# Patient Record
Sex: Male | Born: 1950
Health system: Southern US, Community
[De-identification: ages and names within clinical notes are randomized; demographics above are authoritative.]

## PROBLEM LIST (undated history)

## (undated) DIAGNOSIS — C61 Malignant neoplasm of prostate: Secondary | ICD-10-CM

## (undated) DIAGNOSIS — I10 Essential (primary) hypertension: Secondary | ICD-10-CM

## (undated) DIAGNOSIS — M1990 Unspecified osteoarthritis, unspecified site: Secondary | ICD-10-CM

## (undated) DIAGNOSIS — K649 Unspecified hemorrhoids: Secondary | ICD-10-CM

## (undated) HISTORY — PX: PROSTATECTOMY: SHX69

## (undated) HISTORY — DX: Essential (primary) hypertension: I10

## (undated) HISTORY — DX: Unspecified hemorrhoids: K64.9

## (undated) HISTORY — DX: Malignant neoplasm of prostate: C61

## (undated) HISTORY — PX: KNEE SURGERY: SHX244

---

## 2009-02-16 ENCOUNTER — Encounter: Admission: RE | Admit: 2009-02-16 | Discharge: 2009-02-16 | Payer: Self-pay | Admitting: Cardiology

## 2009-05-22 ENCOUNTER — Ambulatory Visit: Admission: RE | Admit: 2009-05-22 | Discharge: 2009-05-31 | Payer: Self-pay | Admitting: Radiation Oncology

## 2009-06-18 ENCOUNTER — Inpatient Hospital Stay (HOSPITAL_COMMUNITY): Admission: RE | Admit: 2009-06-18 | Discharge: 2009-06-19 | Payer: Self-pay | Admitting: Urology

## 2009-06-18 ENCOUNTER — Encounter (INDEPENDENT_AMBULATORY_CARE_PROVIDER_SITE_OTHER): Payer: Self-pay | Admitting: Urology

## 2010-05-28 ENCOUNTER — Ambulatory Visit: Payer: Self-pay | Admitting: Cardiology

## 2010-07-25 LAB — BASIC METABOLIC PANEL
Chloride: 107 mEq/L (ref 96–112)
GFR calc Af Amer: >60 mL/min (ref >60)
GFR calc non Af Amer: >60 mL/min (ref >60)

## 2010-07-25 LAB — HEMOGLOBIN AND HEMATOCRIT, BLOOD
HCT: 38.8 % — ABNORMAL LOW (ref 39.0–52.0)
HCT: 39.3 % (ref 39.0–52.0)
Hemoglobin: 13 g/dL (ref 13.0–17.0)

## 2010-07-25 LAB — CBC
HCT: 41.4 % (ref 39.0–52.0)
Hemoglobin: 13.8 g/dL (ref 13.0–17.0)
MCHC: 33.4 g/dL (ref 30.0–36.0)
RDW: 14.2 % (ref 11.5–15.5)

## 2010-10-07 IMAGING — CR DG CHEST 2V
2 series · 2 of 2 positions shown · non-contrast
Comparison: None

CLINICAL DATA: For physical exam

CHEST - 2 VIEW

[view not recorded (1 of 2)]
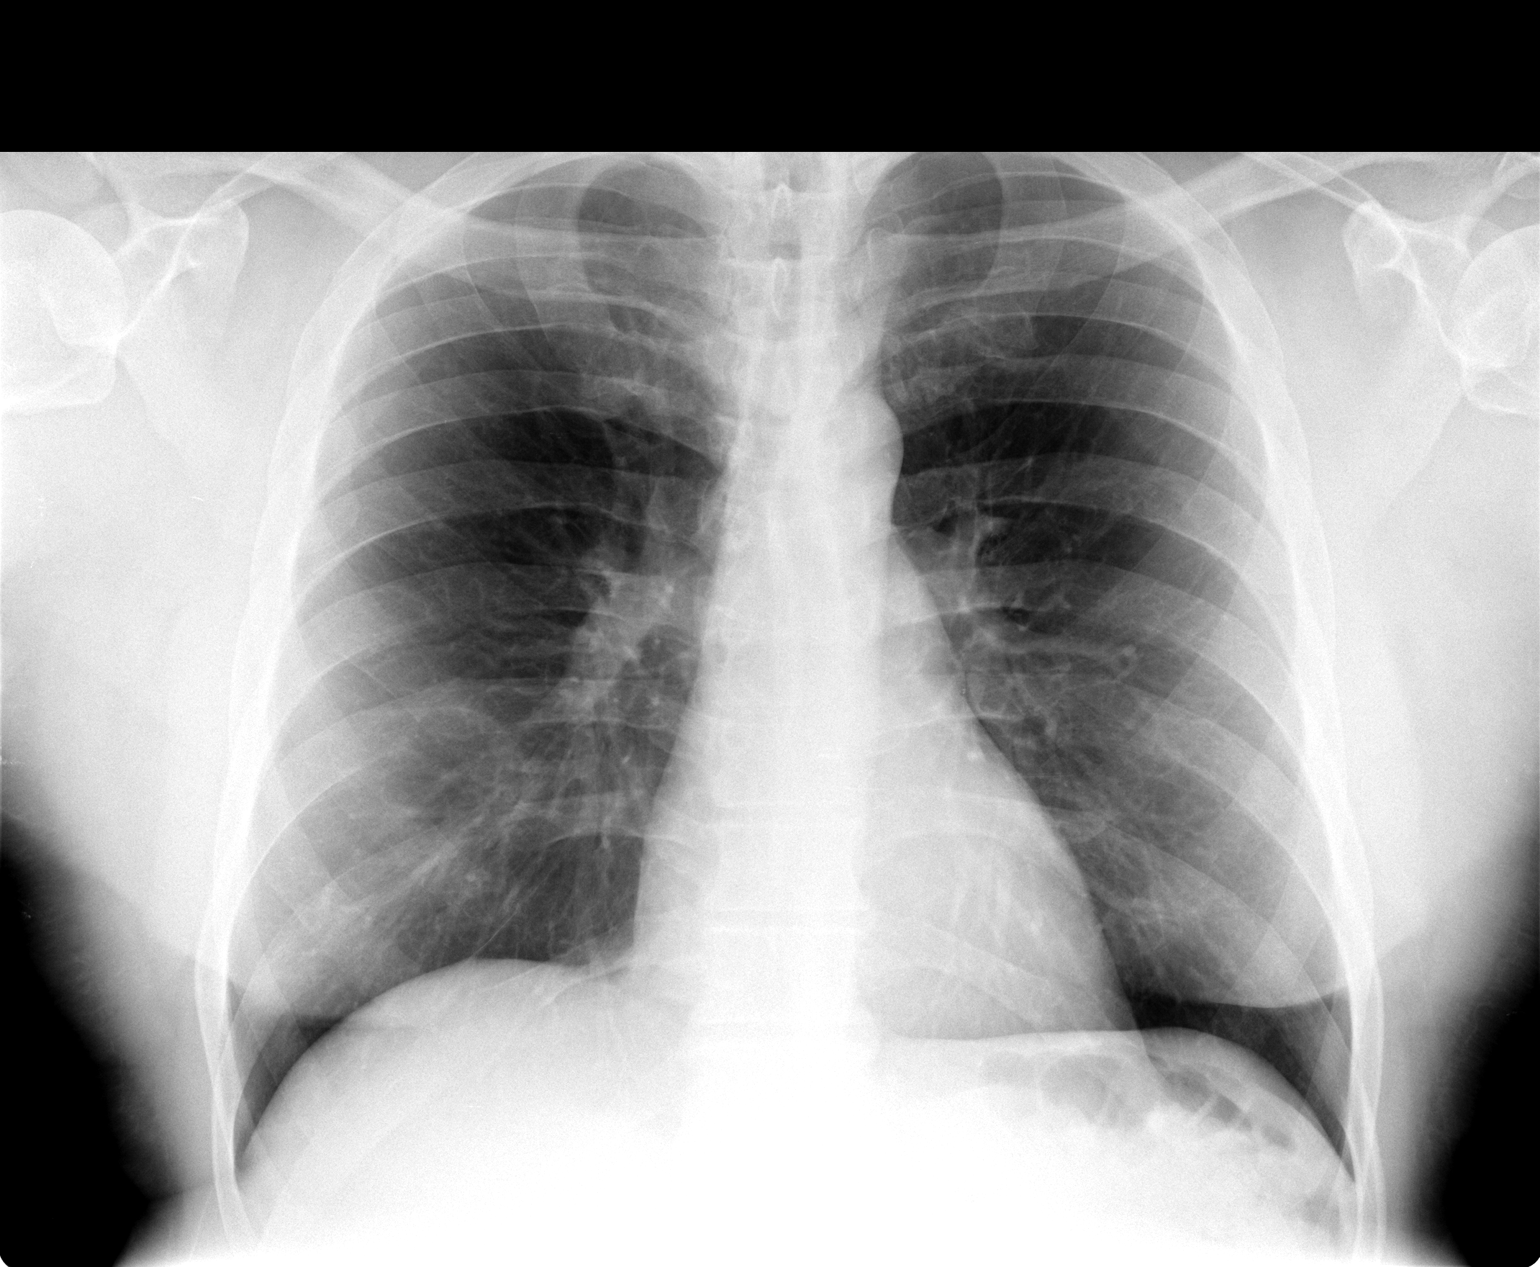

[view not recorded (2 of 2)]
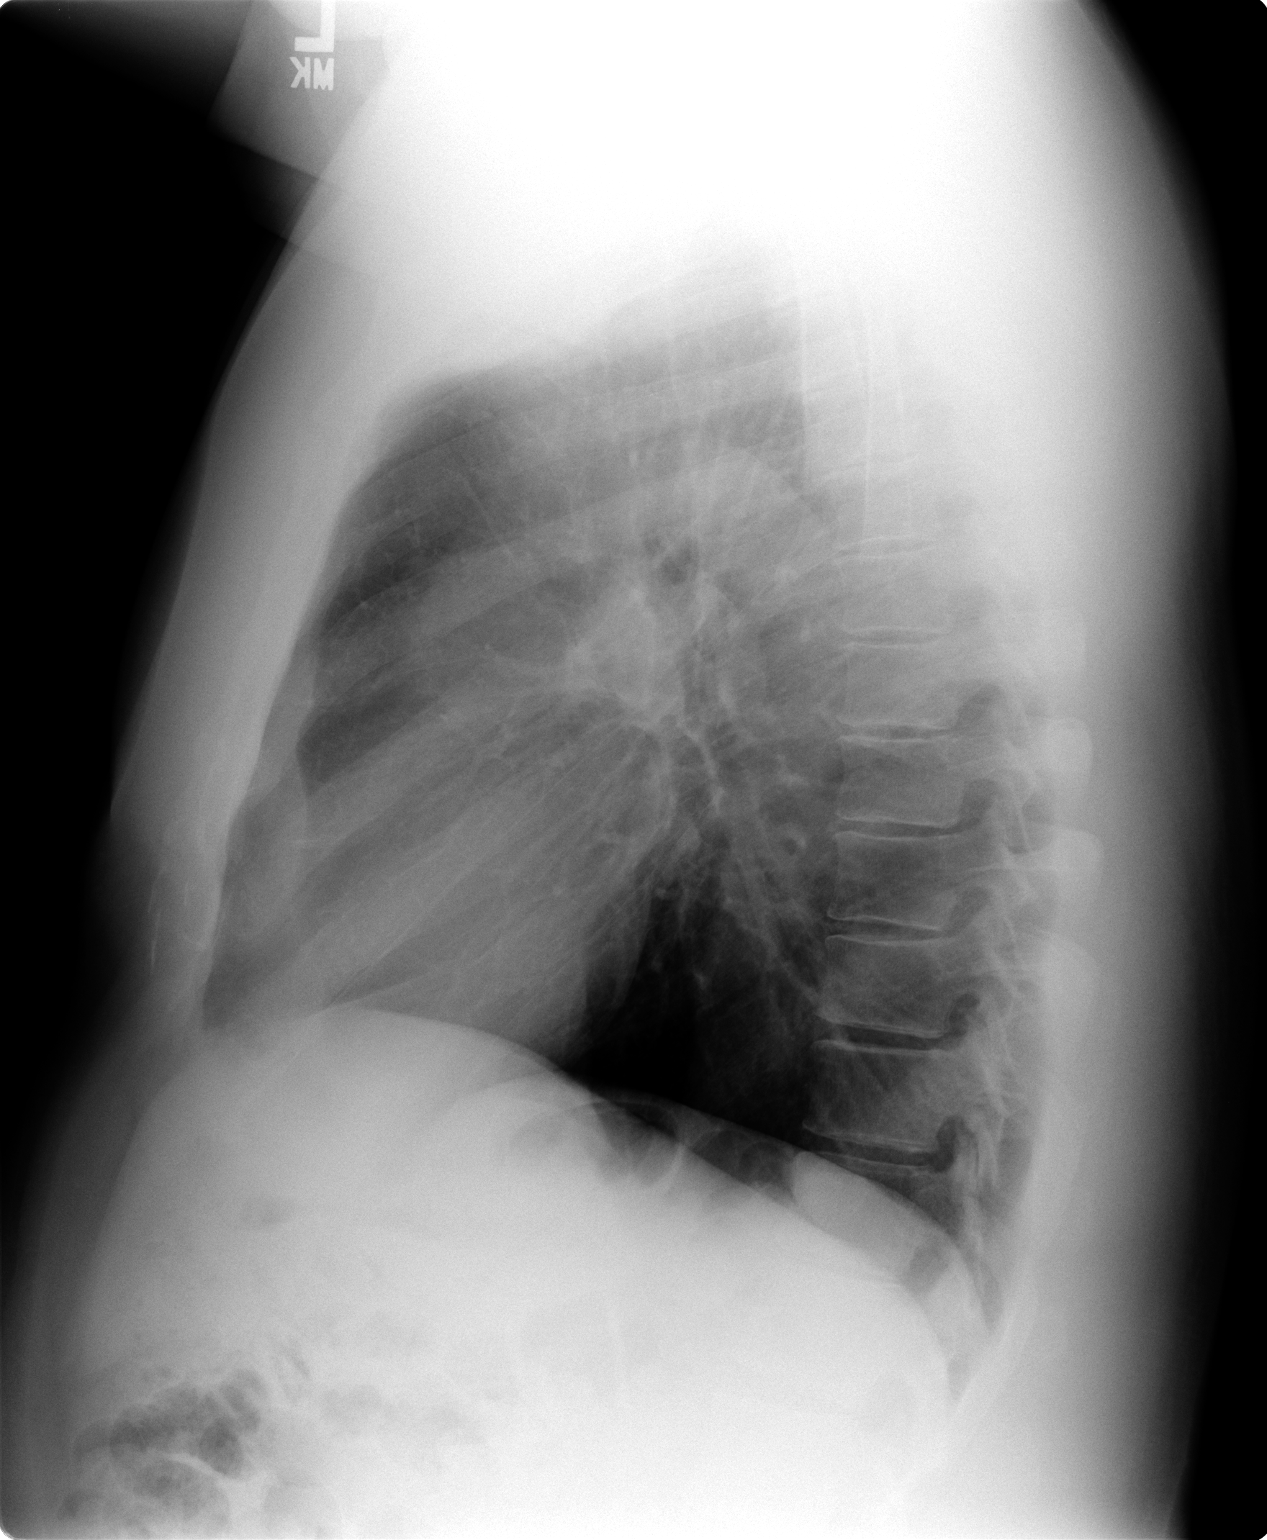

[2 of 2 positions shown; findings below may reference images not displayed]

FINDINGS: The lungs are clear.  No adenopathy is seen.  The heart
is within normal limits in size.  No bony abnormality is seen.
IMPRESSION: No active lung disease.

## 2010-12-10 ENCOUNTER — Encounter: Payer: Self-pay | Admitting: Cardiology

## 2010-12-16 ENCOUNTER — Encounter: Payer: Self-pay | Admitting: Cardiology

## 2010-12-18 ENCOUNTER — Encounter: Payer: Self-pay | Admitting: Cardiology

## 2010-12-18 ENCOUNTER — Ambulatory Visit (INDEPENDENT_AMBULATORY_CARE_PROVIDER_SITE_OTHER): Payer: BC Managed Care – PPO | Admitting: Cardiology

## 2010-12-18 VITALS — BP 120/80 | HR 72 | Wt 197.0 lb

## 2010-12-18 DIAGNOSIS — E7800 Pure hypercholesterolemia, unspecified: Secondary | ICD-10-CM

## 2010-12-18 DIAGNOSIS — C61 Malignant neoplasm of prostate: Secondary | ICD-10-CM

## 2010-12-18 DIAGNOSIS — I119 Hypertensive heart disease without heart failure: Secondary | ICD-10-CM

## 2010-12-18 NOTE — Assessment & Plan Note (Signed)
The patient has a past history of hypercholesterolemia.  His LDL cholesterol on February 16, 2009 was 140.  His LDL cholesterol on 05/24/10 was 141.  The patient was given Lipitor 20 mg daily at that time but took only one month and then stopped it and decided to try to do exercise instead.  He will return tomorrow for a fasting lipid panel to see where we are at the present time.

## 2010-12-18 NOTE — Progress Notes (Signed)
David Mcintyre Date of Birth:  12/09/1950 [facility] [address]   [address] [place], [place]  [address] [phone]           Fax   [phone]  HPI: This very pleasant 60 year old gentleman is seen for a six-month followup office visit.  He has a past history of mild diastolic hypertension and a past history of hypercholesterolemia.  He is not presently on any cardiovascular medication.  His only medicine is glucosamine for arthritis.  He has a past history of prostate cancer and Dr. Laverle Patter did a radical prostatectomy on 06/18/09.  The patient has gotten along well from his surgery.  Since we last saw him he has made a concentrated effort to exercise and to watch his diet and as a result he has been able to lose 21 pounds.  He has been walking and playing golf for exercise.  He is a widower, his wife having died of breast cancer just over a year ago.  Current Outpatient Prescriptions  Medication Sig Dispense Refill  . GLUCOSAMINE HCL PO Take by mouth daily.          No Known Allergies  There is no problem list on file for this patient.   History  Smoking status  . Never Smoker   Smokeless tobacco  . Not on file    History  Alcohol Use No    Family History  Problem Relation Age of Onset  . Cancer Mother     Review of Systems: The patient denies any heat or cold intolerance.  No weight gain or weight loss.  The patient denies headaches or blurry vision.  There is no cough or sputum production.  The patient denies dizziness.  There is no hematuria or hematochezia.  The patient denies any muscle aches or arthritis.  The patient denies any rash.  The patient denies frequent falling or instability.  There is no history of depression or anxiety.  All other systems were reviewed and are negative.   Physical Exam: Filed Vitals:   12/18/10 1138  BP: 120/80  Pulse: 72  The general appearance feels a well-developed well-nourished gentleman in no  distress.The head and neck exam reveals pupils equal and reactive.  Extraocular movements are full.  There is no scleral icterus.  The mouth and pharynx are normal.  The neck is supple.  The carotids reveal no bruits.  The jugular venous pressure is normal.  The  thyroid is not enlarged.  There is no lymphadenopathy.  The chest is clear to percussion and auscultation.  There are no rales or rhonchi.  Expansion of the chest is symmetrical.  The precordium is quiet.  The first heart sound is normal.  The second heart sound is physiologically split.  There is no murmur gallop rub or click.  There is no abnormal lift or heave.  The abdomen is soft and nontender.  The bowel sounds are normal.  The liver and spleen are not enlarged.  There are no abdominal masses.  There are no abdominal bruits.  Extremities reveal good pedal pulses.  There is no phlebitis or edema.  There is no cyanosis or clubbing.  Strength is normal and symmetrical in all extremities.  There is no lateralizing weakness.  There are no sensory deficits.  The skin is warm and dry.  There is no rash.     Assessment / Plan: Continue prudent diet and exercise program.  He will return tomorrow for fasting  lab work.  Recheck in 6 months for followup office visit and fasting lab work.

## 2010-12-18 NOTE — Assessment & Plan Note (Signed)
The patient has a past history of essential hypertension.  Today's blood pressure is normal on careful diet and weight loss regimen

## 2010-12-18 NOTE — Assessment & Plan Note (Signed)
The patient has a past history of prostate cancer.  He underwent radical prostatectomy on 06/18/09 by Dr. Laverle Patter.  Clinically he has been doing well

## 2010-12-19 ENCOUNTER — Other Ambulatory Visit: Payer: Self-pay | Admitting: Cardiology

## 2010-12-19 ENCOUNTER — Other Ambulatory Visit: Payer: BC Managed Care – PPO | Admitting: *Deleted

## 2010-12-19 ENCOUNTER — Other Ambulatory Visit (INDEPENDENT_AMBULATORY_CARE_PROVIDER_SITE_OTHER): Payer: BC Managed Care – PPO | Admitting: *Deleted

## 2010-12-19 DIAGNOSIS — I119 Hypertensive heart disease without heart failure: Secondary | ICD-10-CM

## 2010-12-20 LAB — LIPID PANEL
Cholesterol: 204 mg/dL — ABNORMAL HIGH (ref 0–200)
HDL: 70 mg/dL (ref >39.00)
Total CHOL/HDL Ratio: 3
Triglycerides: 43 mg/dL (ref 0.0–149.0)
VLDL: 8.6 mg/dL (ref 0.0–40.0)

## 2010-12-20 LAB — BASIC METABOLIC PANEL
Chloride: 109 mEq/L (ref 96–112)
Creatinine, Ser: 1.1 mg/dL (ref 0.4–1.5)
GFR: 90.66 mL/min (ref >60.00)
Potassium: 4.3 mEq/L (ref 3.5–5.1)
Sodium: 140 mEq/L (ref 135–145)

## 2010-12-20 LAB — HEPATIC FUNCTION PANEL
ALT: 25 U/L (ref 0–53)
AST: 32 U/L (ref 0–37)
Albumin: 4.4 g/dL (ref 3.5–5.2)
Total Bilirubin: 0.9 mg/dL (ref 0.3–1.2)
Total Protein: 7.4 g/dL (ref 6.0–8.3)

## 2010-12-20 LAB — LDL CHOLESTEROL, DIRECT: Direct LDL: 123.4 mg/dL

## 2010-12-23 ENCOUNTER — Telehealth: Payer: Self-pay | Admitting: *Deleted

## 2010-12-23 NOTE — Telephone Encounter (Signed)
Message copied by Eugenia Pancoast on Mon Dec 23, 2010 10:14 AM ------      Message from: Cassell Clement      Created: Sat Dec 21, 2010  8:08 PM       LDL improving so stay on careful diet and we do not need to restart the statin yet.

## 2010-12-23 NOTE — Telephone Encounter (Signed)
Advised of labs 

## 2013-06-08 ENCOUNTER — Encounter: Payer: Self-pay | Admitting: Gastroenterology

## 2013-06-14 ENCOUNTER — Ambulatory Visit (INDEPENDENT_AMBULATORY_CARE_PROVIDER_SITE_OTHER): Payer: BC Managed Care – PPO | Admitting: Gastroenterology

## 2013-06-14 ENCOUNTER — Encounter: Payer: Self-pay | Admitting: Gastroenterology

## 2013-06-14 VITALS — BP 122/78 | HR 76 | Ht 68.0 in | Wt 221.8 lb

## 2013-06-14 DIAGNOSIS — K625 Hemorrhage of anus and rectum: Secondary | ICD-10-CM

## 2013-06-14 MED ORDER — HYDROCORTISONE ACETATE 25 MG RE SUPP: 25.0000 mg | Freq: Two times a day (BID) | RECTAL | Status: DC

## 2013-06-14 NOTE — Progress Notes (Signed)
    _                                                                                                                History of Present Illness:  63 year old Afro-American male with history of prostate CA, status post prostatectomy, referred for evaluation of rectal bleeding.  On one occasion he saw blood mixed with stools.  This was when he had a hard bowel movement.  He denies change of bowel habits or abdominal pain.  He does have rectal discomfort and pruritus.    Past Medical History  Diagnosis Date  . Prostate cancer   . Hypertension     MILD DIASTOLIC HYPERTENSION   Past Surgical History  Procedure Laterality Date  . Prostatectomy      RADICAL PROSTATECTOMY  . Knee surgery     family history includes Cancer in his mother. Current Outpatient Prescriptions  Medication Sig Dispense Refill  . vitamin B-12 (CYANOCOBALAMIN) 1000 MCG tablet Take 1,000 mcg by mouth daily.      Marland Kitchen GLUCOSAMINE HCL PO Take by mouth daily.         No current facility-administered medications for this visit.   Allergies as of 06/14/2013  . (No Known Allergies)    reports that he has never smoked. He has never used smokeless tobacco. He reports that he does not drink alcohol or use illicit drugs.     Review of Systems: Pertinent positive and negative review of systems were noted in the above HPI section. All other review of systems were otherwise negative.  Vital signs were reviewed in today's medical record Physical Exam: General: Well developed , well nourished, no acute distress Skin: anicteric Head: Normocephalic and atraumatic Eyes:  sclerae anicteric, EOMI Ears: Normal auditory acuity Mouth: No deformity or lesions Neck: Supple, no masses or thyromegaly Lungs: Clear throughout to auscultation Heart: Regular rate and rhythm; no murmurs, rubs or bruits Abdomen: Soft, non tender and non distended. No masses, hepatosplenomegaly or hernias noted. Normal Bowel sounds Rectal:  There are no external lesions Musculoskeletal: Symmetrical with no gross deformities  Skin: No lesions on visible extremities Pulses:  Normal pulses noted Extremities: No clubbing, cyanosis, edema or deformities noted Neurological: Alert oriented x 4, grossly nonfocal Cervical Nodes:  No significant cervical adenopathy Inguinal Nodes: No significant inguinal adenopathy Psychological:  Alert and cooperative. Normal mood and affect  See Assessment and Plan under Problem List

## 2013-06-14 NOTE — Patient Instructions (Signed)
You have been scheduled for a colonoscopy with propofol. Please follow written instructions given to you at your visit today.  Please pick up your prep kit at the pharmacy within the next 1-3 days. If you use inhalers (even only as needed), please bring them with you on the day of your procedure. Your physician has requested that you go to www.startemmi.com and enter the access code given to you at your visit today. This web site gives a general overview about your procedure. However, you should still follow specific instructions given to you by our office regarding your preparation for the procedure.  We have given you a Moviprep kit today 

## 2013-06-14 NOTE — Assessment & Plan Note (Signed)
Has had one very limited episode of rectal bleeding.  Symptoms could be do to hemorrhoids.  A more proximal colonic bleeding source should be ruled out.  Recommendations #1 colonoscopy #2 Anusol HC suppositories

## 2013-07-06 ENCOUNTER — Ambulatory Visit (AMBULATORY_SURGERY_CENTER): Payer: BC Managed Care – PPO | Admitting: Gastroenterology

## 2013-07-06 ENCOUNTER — Encounter: Payer: Self-pay | Admitting: Gastroenterology

## 2013-07-06 VITALS — BP 122/79 | HR 71 | Temp 98.6°F | Resp 16 | Ht 68.0 in | Wt 221.0 lb

## 2013-07-06 DIAGNOSIS — K648 Other hemorrhoids: Secondary | ICD-10-CM

## 2013-07-06 DIAGNOSIS — K625 Hemorrhage of anus and rectum: Secondary | ICD-10-CM

## 2013-07-06 MED ORDER — SODIUM CHLORIDE 0.9 % IV SOLN: 500.0000 mL | INTRAVENOUS | Status: DC

## 2013-07-06 MED ORDER — HYDROCORTISONE ACETATE 25 MG RE SUPP: 25.0000 mg | Freq: Two times a day (BID) | RECTAL | Status: DC

## 2013-07-06 NOTE — Progress Notes (Signed)
Lidocaine-40mg IV prior to Propofol InductionPropofol given over incremental dosages 

## 2013-07-06 NOTE — Patient Instructions (Signed)
YOU HAD AN ENDOSCOPIC PROCEDURE TODAY AT [facility]: Refer to the procedure report that was given to you for any specific questions about what was found during the examination.  If the procedure report does not answer your questions, please call your gastroenterologist to clarify.  If you requested that your care partner not be given the details of your procedure findings, then the procedure report has been included in a sealed envelope for you to review at your convenience later.  YOU SHOULD EXPECT: Some feelings of bloating in the abdomen. Passage of more gas than usual.  Walking can help get rid of the air that was put into your GI tract during the procedure and reduce the bloating. If you had a lower endoscopy (such as a colonoscopy or flexible sigmoidoscopy) you may notice spotting of blood in your stool or on the toilet paper. If you underwent a bowel prep for your procedure, then you may not have a normal bowel movement for a few days.  DIET: Your first meal following the procedure should be a light meal and then it is ok to progress to your normal diet.  A half-sandwich or bowl of soup is an example of a good first meal.  Heavy or fried foods are harder to digest and may make you feel nauseous or bloated.  Likewise meals heavy in dairy and vegetables can cause extra gas to form and this can also increase the bloating.  Drink plenty of fluids but you should avoid alcoholic beverages for 24 hours.  ACTIVITY: Your care partner should take you home directly after the procedure.  You should plan to take it easy, moving slowly for the rest of the day.  You can resume normal activity the day after the procedure however you should NOT DRIVE or use heavy machinery for 24 hours (because of the sedation medicines used during the test).    SYMPTOMS TO REPORT IMMEDIATELY: A gastroenterologist can be reached at any hour.  During normal business hours, 8:30 AM to 5:00 PM Monday through Friday,  call [phone].  After hours and on weekends, please call the GI answering service at [phone] who will take a message and have the physician on call contact you.   Following lower endoscopy (colonoscopy or flexible sigmoidoscopy):  Excessive amounts of blood in the stool  Significant tenderness or worsening of abdominal pains  Swelling of the abdomen that is new, acute  Fever of 100F or higher  FOLLOW UP: If any biopsies were taken you will be contacted by phone or by letter within the next 1-3 weeks.  Call your gastroenterologist if you have not heard about the biopsies in 3 weeks.  Our staff will call the home number listed on your records the next business day following your procedure to check on you and address any questions or concerns that you may have at that time regarding the information given to you following your procedure. This is a courtesy call and so if there is no answer at the home number and we have not heard from you through the emergency physician on call, we will assume that you have returned to your regular daily activities without incident.   Hemorrhoids, banding-handouts given  anusol suppositories as needed for rectal bleeding.  Repeat colonoscopy 10 years.   SIGNATURES/CONFIDENTIALITY: You and/or your care partner have signed paperwork which will be entered into your electronic medical record.  These signatures attest to the fact that that the information above  on your After Visit Summary has been reviewed and is understood.  Full responsibility of the confidentiality of this discharge information lies with you and/or your care-partner.

## 2013-07-06 NOTE — Op Note (Signed)
[facility]  [address] [place], [address]   COLONOSCOPY PROCEDURE REPORT  PATIENT: David Mcintyre, David Mcintyre  MR#: [id number] BIRTHDATE: 03-01-1951 , 62  yrs. old GENDER: Male ENDOSCOPIST: Inda Castle, MD REFERRED PX:TGGYIR Mare Ferrari, M.D. PROCEDURE DATE:  07/06/2013 PROCEDURE:   Colonoscopy, diagnostic First Screening Colonoscopy - Avg.  risk and is 50 yrs.  old or older Yes.  Prior Negative Screening - Now for repeat screening. N/A  History of Adenoma - Now for follow-up colonoscopy & has been > or = to 3 yrs.  N/A  Polyps Removed Today? No.  Recommend repeat exam, <10 yrs? No. ASA CLASS:   Class II INDICATIONS:Rectal Bleeding. MEDICATIONS: MAC sedation, administered by CRNA and propofol (Diprivan) 250mg  IV  DESCRIPTION OF PROCEDURE:   After the risks benefits and alternatives of the procedure were thoroughly explained, informed consent was obtained.  A digital rectal exam revealed no abnormalities of the rectum.   The LB [id number]  endoscope was introduced through the anus and advanced to the cecum, which was identified by both the appendix and ileocecal valve. No adverse events experienced.   The quality of the prep was Suprep good  The instrument was then slowly withdrawn as the colon was fully examined.      COLON FINDINGS: Internal hemorrhoids were found.   A normal appearing cecum, ileocecal valve, and appendiceal orifice were identified.  The ascending, hepatic flexure, transverse, splenic flexure, descending, sigmoid colon and rectum appeared unremarkable.  No polyps or cancers were seen.  Retroflexed views revealed no abnormalities. The time to cecum=3 minutes 18 seconds. Withdrawal time=9 minutes 56 seconds.  The scope was withdrawn and the procedure completed. COMPLICATIONS: There were no complications.  ENDOSCOPIC IMPRESSION: 1.   Internal hemorrhoids (source for limited rectal bleeding) 2.   Normal colon  RECOMMENDATIONS: 1.   Anusol suppositories as needed for rectal bleeding 2.  colonoscopy 10 years  eSigned:  Inda Castle, MD 07/06/2013 3:55 PM   cc:   PATIENT NAME:  David Mcintyre, David Mcintyre MR#: [id number]

## 2013-07-07 ENCOUNTER — Telehealth: Payer: Self-pay | Admitting: *Deleted

## 2013-07-07 NOTE — Telephone Encounter (Signed)
  Follow up Call-  Call back number 07/06/2013  Post procedure Call Back phone  # [phone]  Permission to leave phone message Yes    [facility]

## 2016-02-03 ENCOUNTER — Emergency Department (HOSPITAL_COMMUNITY): Payer: Medicare Other

## 2016-02-03 ENCOUNTER — Encounter (HOSPITAL_COMMUNITY): Payer: Self-pay | Admitting: *Deleted

## 2016-02-03 ENCOUNTER — Emergency Department (HOSPITAL_COMMUNITY)
Admission: EM | Admit: 2016-02-03 | Discharge: 2016-02-03 | Disposition: A | Discharge status: Home / Self Care | Payer: Medicare Other | Attending: Emergency Medicine | Admitting: Emergency Medicine

## 2016-02-03 DIAGNOSIS — Y999 Unspecified external cause status: Secondary | ICD-10-CM | POA: Insufficient documentation

## 2016-02-03 DIAGNOSIS — I10 Essential (primary) hypertension: Secondary | ICD-10-CM | POA: Diagnosis not present

## 2016-02-03 DIAGNOSIS — X501XXA Overexertion from prolonged static or awkward postures, initial encounter: Secondary | ICD-10-CM | POA: Insufficient documentation

## 2016-02-03 DIAGNOSIS — Z79899 Other long term (current) drug therapy: Secondary | ICD-10-CM | POA: Insufficient documentation

## 2016-02-03 DIAGNOSIS — Z8546 Personal history of malignant neoplasm of prostate: Secondary | ICD-10-CM | POA: Diagnosis not present

## 2016-02-03 DIAGNOSIS — S86912A Strain of unspecified muscle(s) and tendon(s) at lower leg level, left leg, initial encounter: Secondary | ICD-10-CM | POA: Diagnosis not present

## 2016-02-03 DIAGNOSIS — Y929 Unspecified place or not applicable: Secondary | ICD-10-CM | POA: Diagnosis not present

## 2016-02-03 DIAGNOSIS — Y9389 Activity, other specified: Secondary | ICD-10-CM | POA: Diagnosis not present

## 2016-02-03 DIAGNOSIS — S8992XA Unspecified injury of left lower leg, initial encounter: Secondary | ICD-10-CM | POA: Diagnosis present

## 2016-02-03 NOTE — Discharge Instructions (Signed)
Your x-ray today showed no acute abnormalities. We gave you a knee sleeve to use for compression and stability. Keep your leg elevated when resting at home and ice on and off for the next 48 hrs. Please follow up with Dr. Ninfa Linden of [facility] for further evaluation. In the meantime you may take over the counter ibuprofen or Tylenol as needed for pain. Return to the ER for new or worsening symptoms.

## 2016-02-03 NOTE — ED Triage Notes (Addendum)
Pt was removing his shoes and felt something pop a little under his left knee. Pain with certain movements. Son is at the bedside. Pt placed on a hospital gown. Very pleasant and cooperative. Pt taken via wheelchair to the x-ray dept. (1:45pm)

## 2016-02-03 NOTE — ED Provider Notes (Signed)
[facility] DEPT Provider Note   CSN: [id number] Arrival date & time: 02/03/16  1314  By signing my name below, I, Soijett Blue, attest that this documentation has been prepared under the direction and in the presence of Alnisa Hasley Y. Lash Matulich, PA-C Electronically Signed: Soijett Blue, ED Scribe. 02/03/16. 1:40 PM.   History   Chief Complaint Chief Complaint  Patient presents with  . Knee Pain    Pt was taking off his dress shoes and felt a pop in his left knee.    HPI David Mcintyre is a 65 y.o. male with a PMHx of HTN, prostate CA, who presents to the Emergency Department complaining of left knee pain onset PTA. Pt notes that he was bent over taking off his dress shoes when he felt a pop to his posterior left knee. Pt states that his posterior left knee is worsened with weight bearing and denies alleviating factors. Pt denies left knee pain at this time. Pt is having associated symptoms of gait problem due to pain. He notes that he has not tried any medications for the relief of his symptoms. He denies color change, wound, rash, swelling, left ankle pain, and any other symptoms. Pt denies seeing an orthopedic doctor at this time.    The history is provided by the patient. No language interpreter was used.    Past Medical History:  Diagnosis Date  . Hypertension    MILD DIASTOLIC HYPERTENSION  . Prostate cancer [facility])     Patient Active Problem List   Diagnosis Date Noted  . Hemorrhage of rectum and anus 06/14/2013  . Hypercholesterolemia 12/18/2010  . Benign hypertensive heart disease without heart failure 12/18/2010  . Prostate cancer ([facility]) 12/18/2010    Past Surgical History:  Procedure Laterality Date  . KNEE SURGERY    . PROSTATECTOMY     RADICAL PROSTATECTOMY       Home Medications    Prior to Admission medications   Medication Sig Start Date End Date Taking? Authorizing Provider  GLUCOSAMINE HCL PO Take by mouth daily.     Yes Historical Provider, MD  vitamin  B-12 (CYANOCOBALAMIN) 1000 MCG tablet Take 1,000 mcg by mouth daily.   Yes Historical Provider, MD  hydrocortisone (ANUSOL-HC) 25 MG suppository Place 1 suppository (25 mg total) rectally every 12 (twelve) hours. 07/06/13   Inda Castle, MD    Family History Family History  Problem Relation Age of Onset  . Cancer Mother     Social History Social History  Substance Use Topics  . Smoking status: Never Smoker  . Smokeless tobacco: Never Used  . Alcohol use No     Allergies   Review of patient's allergies indicates no known allergies.   Review of Systems Review of Systems  Musculoskeletal: Positive for arthralgias (posterior left knee) and gait problem (due to pain). Negative for joint swelling.  Skin: Negative for color change, rash and wound.  All other systems reviewed and are negative.    Physical Exam Updated Vital Signs BP 114/84 (BP Location: Right Arm)   Pulse 80   Temp 98.2 F (36.8 C) (Oral)   Resp 20   Ht 5\' 8"  (1.727 m)   Wt 190 lb (86.2 kg)   SpO2 99%   BMI 28.89 kg/m   Physical Exam  Constitutional: He is oriented to person, place, and time. He appears well-developed and well-nourished. No distress.  Well appearing NAD.   HENT:  Head: Normocephalic and atraumatic.  Eyes: EOM are normal.  Neck: Neck supple.  Cardiovascular: Normal rate.   2+ posterior tibialis and dorsalis pedis pulses.   Pulmonary/Chest: Effort normal. No respiratory distress.  Abdominal: He exhibits no distension.  Musculoskeletal: Normal range of motion.       Left knee: He exhibits normal range of motion, no swelling, no LCL laxity and no MCL laxity. No tenderness found.  No tenderness to knee or posterior popliteal region. No edema. Full ROM with no laxity or crepitus, but pt reports pain with knee flexion against resistance.   Neurological: He is alert and oriented to person, place, and time.  5/5 strength throughout  Skin: Skin is warm and dry.  Psychiatric: He has a  normal mood and affect. His behavior is normal.  Nursing note and vitals reviewed.   ED Treatments / Results  DIAGNOSTIC STUDIES: Oxygen Saturation is 99% on RA, nl by my interpretation.    COORDINATION OF CARE: 1:37 PM Discussed treatment plan with pt at bedside which includes left knee xray, knee brace, use motrin PRN, and referral and follow up with orthopedist, and pt agreed to plan.   Radiology Dg Knee Complete 4 Views Left  Result Date: 02/03/2016 CLINICAL DATA:  Pt was removing his shoes and felt something "pop" behind the left knee. Pt complains of pain and limited movement. EXAM: LEFT KNEE - COMPLETE 4+ VIEW COMPARISON:  None. FINDINGS: No fracture of the proximal tibia or distal femur. Patella is normal. No joint effusion. IMPRESSION: No acute osseous abnormality. Electronically Signed   By: Suzy Bouchard M.D.   On: 02/03/2016 14:03    Procedures Procedures (including critical care time)  Medications Ordered in ED Medications - No data to display   Initial Impression / Assessment and Plan / ED Course  I have reviewed the triage vital signs and the nursing notes.  Pertinent imaging results that were available during my care of the patient were reviewed by me and considered in my medical decision making (see chart for details).  Clinical Course    Patient X-Ray negative for obvious fracture or dislocation.  Pt advised to follow up with orthopedics. Patient given knee sleeve while in ED, conservative therapy recommended and discussed. Patient will be discharged home & is agreeable with above plan. Returns precautions discussed. Pt appears safe for discharge.  Final Clinical Impressions(s) / ED Diagnoses   Final diagnoses:  Strain of left knee, initial encounter    New Prescriptions New Prescriptions   No medications on file    I personally performed the services described in this documentation, which was scribed in my presence. The recorded information has been  reviewed and is accurate.    Anne Ng, PA-C 02/03/16 [place], MD 02/03/16 [phone]

## 2016-02-12 ENCOUNTER — Ambulatory Visit (INDEPENDENT_AMBULATORY_CARE_PROVIDER_SITE_OTHER): Payer: Self-pay | Admitting: Orthopaedic Surgery

## 2016-02-13 ENCOUNTER — Ambulatory Visit (INDEPENDENT_AMBULATORY_CARE_PROVIDER_SITE_OTHER): Payer: Medicare Other | Admitting: Orthopaedic Surgery

## 2016-02-13 DIAGNOSIS — S76312A Strain of muscle, fascia and tendon of the posterior muscle group at thigh level, left thigh, initial encounter: Secondary | ICD-10-CM | POA: Diagnosis not present

## 2016-02-14 ENCOUNTER — Ambulatory Visit (INDEPENDENT_AMBULATORY_CARE_PROVIDER_SITE_OTHER): Payer: Self-pay | Admitting: Orthopaedic Surgery

## 2016-02-19 ENCOUNTER — Ambulatory Visit (INDEPENDENT_AMBULATORY_CARE_PROVIDER_SITE_OTHER): Payer: Self-pay | Admitting: Orthopaedic Surgery

## 2016-02-20 ENCOUNTER — Ambulatory Visit (INDEPENDENT_AMBULATORY_CARE_PROVIDER_SITE_OTHER): Payer: Self-pay | Admitting: Orthopaedic Surgery

## 2018-04-22 ENCOUNTER — Ambulatory Visit: Payer: Medicare Other | Admitting: Gastroenterology

## 2018-04-22 ENCOUNTER — Encounter: Payer: Self-pay | Admitting: Gastroenterology

## 2018-04-22 VITALS — BP 140/84 | HR 84 | Ht 68.0 in | Wt 224.0 lb

## 2018-04-22 DIAGNOSIS — K6289 Other specified diseases of anus and rectum: Secondary | ICD-10-CM

## 2018-04-22 DIAGNOSIS — K644 Residual hemorrhoidal skin tags: Secondary | ICD-10-CM

## 2018-04-22 NOTE — Progress Notes (Signed)
[facility] Consult Note:  History: David Mcintyre 04/22/2018  Referring physician: Darlin Coco, MD  Reason for consult/chief complaint: Hemorrhoids (pt has had some rectal discomfort and when he wipes after a bm he can sometimes feel something protruding; denies seeing any blood)   Subjective  HPI:  This is a very pleasant 67 year old man referred to see [place] for rectal discomfort.  He saw Dr. Deatra Ina in 2015 for rectal pain and bleeding, and a colonoscopy at that time with a good preparation was normal except for reported internal hemorrhoids.  For the several months David Mcintyre has been bothered by a feeling of rectal pressure that is worse when he sits down.  It does not bother him standing or walking.  There is no pain with bowel movements or rectal bleeding.  He feels as if there is a palpable abnormality in the anal canal. He denies chronic abdominal pain, constipation, diarrhea, dysphagia, odynophagia, vomiting or weight loss.  ROS:  Review of Systems He denies chest pain dyspnea or dysuria  Past Medical History: Past Medical History:  Diagnosis Date  . Hemorrhoids   . Hypertension    MILD DIASTOLIC HYPERTENSION  . Prostate cancer [facility])      Past Surgical History: Past Surgical History:  Procedure Laterality Date  . KNEE SURGERY    . PROSTATECTOMY     RADICAL PROSTATECTOMY     Family History: Family History  Problem Relation Age of Onset  . Cancer Mother     Social History: Social History   Socioeconomic History  . Marital status: Married    Spouse name: Not on file  . Number of children: 2  . Years of education: Not on file  . Highest education level: Not on file  Occupational History  . Occupation: Retired    Fish farm manager: RETIRED  Social Needs  . Financial resource strain: Not on file  . Food insecurity:    Worry: Not on file    Inability: Not on file  . Transportation needs:    Medical: Not on file    Non-medical:  Not on file  Tobacco Use  . Smoking status: Never Smoker  . Smokeless tobacco: Never Used  Substance and Sexual Activity  . Alcohol use: No  . Drug use: No  . Sexual activity: Not on file  Lifestyle  . Physical activity:    Days per week: Not on file    Minutes per session: Not on file  . Stress: Not on file  Relationships  . Social connections:    Talks on phone: Not on file    Gets together: Not on file    Attends religious service: Not on file    Active member of club or organization: Not on file    Attends meetings of clubs or organizations: Not on file    Relationship status: Not on file  Other Topics Concern  . Not on file  Social History Narrative  . Not on file    Allergies: No Known Allergies  Outpatient Meds: Current Outpatient Medications  Medication Sig Dispense Refill  . GLUCOSAMINE HCL PO Take by mouth daily.      . hydrocortisone (ANUSOL-HC) 25 MG suppository Place 1 suppository (25 mg total) rectally every 12 (twelve) hours. 12 suppository 1  . vitamin B-12 (CYANOCOBALAMIN) 1000 MCG tablet Take 1,000 mcg by mouth daily.     No current facility-administered medications for this visit.       ___________________________________________________________________ Objective   Exam:  BP  140/84   Pulse 84   Ht 5\' 8"  (1.727 m)   Wt 224 lb (101.6 kg)   BMI 34.06 kg/m    General: this is a(n) well-appearing man  Eyes: sclera anicteric, no redness  ENT: oral mucosa moist without lesions, no cervical or supraclavicular lymphadenopathy  CV: RRR without murmur, S1/S2, no JVD, no peripheral edema  Resp: clear to auscultation bilaterally, normal RR and effort noted  GI: soft, no tenderness, with active bowel sounds. No guarding or palpable organomegaly noted.  Skin; warm and dry, no rash or jaundice noted  Neuro: awake, alert and oriented x 3. Normal gross motor function and fluent speech Rectal: Perianal and rectal exam reveals a prominent but soft  and nonthrombosed and nontender external hemorrhoid 9 o'clock position.  DRE finds no fissure or palpable internal lesion or tenderness.  Soft: Rectal vault. Anoscopy reveals no significant enlargement of internal hemorrhoidal plexus.   Assessment: Encounter Diagnoses  Name Primary?  . External hemorrhoid Yes  . Rectal pain     Chronic dull discomfort in sitting position from external hemorrhoid.  No other pathology found on exam.  He has not improved with local therapy such as Preparation H cream or sits baths.  I offered him referral to surgery for iteration of a surgical excision, and he is agreeable.  Plan:  Referral to [facility] surgery.  Thank you for the courtesy of this consult.  Please call me with any questions or concerns.  David Mcintyre  CC: Referring provider noted above

## 2018-04-22 NOTE — Patient Instructions (Signed)
If you are age 67 or older, your body mass index should be between 23-30. Your Body mass index is 34.06 kg/m. If this is out of the aforementioned range listed, please consider follow up with your Primary Care Provider.  If you are age 24 or younger, your body mass index should be between 19-25. Your Body mass index is 34.06 kg/m. If this is out of the aformentioned range listed, please consider follow up with your Primary Care Provider.   We will send your records to  [facility]. Make certain to bring a list of current medications, including any over the counter medications or vitamins. Also bring your co-pay if you have one as well as your insurance cards. [facility] is located at [address]. Should you need to reschedule your appointment, please contact them at [phone].   It was a pleasure to see you today!  Dr. Loletha Carrow

## 2018-04-23 ENCOUNTER — Telehealth: Payer: Self-pay

## 2018-04-23 NOTE — Telephone Encounter (Signed)
Records faxed to [facility] for symptomatic internal hemorrhoids. Will await appointment info.

## 2018-05-04 NOTE — Telephone Encounter (Signed)
[facility] referral made for 05-18-2018 at 930am

## 2018-05-18 ENCOUNTER — Other Ambulatory Visit: Payer: Self-pay | Admitting: Surgery

## 2018-05-18 ENCOUNTER — Ambulatory Visit: Payer: Self-pay | Admitting: Surgery

## 2018-05-18 NOTE — H&P (Signed)
David Mcintyre Documented: 05/18/2018 9:38 AM Location: David Mcintyre Patient #: [id number] DOB: [birth year] Single / Language: David Mcintyre / Race: Black or African American Male  History of Present Illness David Hector MD; 05/18/2018 10:20 AM) The patient is a 68 year old male who presents with hemorrhoids. Note for "Hemorrhoids": ` ` ` Patient sent for surgical consultation at the request of David Mcintyre, M.D. [facility]  Chief Complaint: Persistent/worsening hemorrhoid ` ` The patient is a pleasant gentleman with some hemorrhoid issues for many years. Usually mild and manageable. However this past year he has noticed hemorrhoid on the outside that is causing discomfort and annoying. He is tried suppositories and other interventions. He moves his bowels about every other day. Because of the discomfort he was sent for gastroenterology. Colonoscopy done that was rather under warming but did confirm internal/external hemorrhoids. Surgical consultation offered for more aggressive management. Patient has a history of prostate cancer status post robotic prostatectomy by Dr. Raynelle Mcintyre 7 years ago. Results of that. No urinary incontinence. Patient also has some osteoarthritis. He is followed by Dr. Denice Mcintyre were intact with with PDX. It sounds like patient is leaning towards a right knee replacement in the near future. However, the hemorrhoid is bothering him more and he would like to try and have that addressed first. He does not smoke. No cardiopulmonary issues. He can walk a half hour without difficulty.  No personal nor family history of GI/colon cancer, inflammatory bowel disease, irritable bowel syndrome, allergy such as Celiac Sprue, dietary/dairy problems, colitis, ulcers nor gastritis. No recent sick contacts/gastroenteritis. No travel outside the country. No changes in diet. No dysphagia to solids or liquids. No significant heartburn or reflux. No  hematochezia, hematemesis, coffee ground emesis. No evidence of prior gastric/peptic ulceration.  (Review of systems as stated in this history (HPI) or in the review of systems. Otherwise all other 12 point ROS are negative) ` ` `   Past Surgical History David Mcintyre, CMA; 05/18/2018 9:44 AM) TURP  Diagnostic Studies History David Mcintyre, CMA; 05/18/2018 9:44 AM) Colonoscopy within last year  Allergies (David Mcintyre, CMA; 05/18/2018 9:45 AM) No Known Drug Allergies [05/18/2018]: Allergies Reconciled  Medication History David Mcintyre, CMA; 05/18/2018 9:45 AM) No Current Medications Medications Reconciled  Social History David Mcintyre, CMA; 05/18/2018 9:44 AM) Alcohol use Occasional alcohol use. Caffeine use Coffee. No drug use Tobacco use Never smoker.  Family History David Mcintyre, [place]; 05/18/2018 9:44 AM) Hypertension Mother.  Other Problems David Mcintyre, CMA; 05/18/2018 9:44 AM) Hemorrhoids     Review of Systems (David Mcintyre; 05/18/2018 9:44 AM) General Not Present- Appetite Loss, Chills, Fatigue, Fever, Night Sweats, Weight Gain and Weight Loss. Skin Not Present- Change in Wart/Mole, Dryness, Hives, Jaundice, New Lesions, Non-Healing Wounds, Rash and Ulcer. HEENT Not Present- Earache, Hearing Loss, Hoarseness, Nose Bleed, Oral Ulcers, Ringing in the Ears, Seasonal Allergies, Sinus Pain, Sore Throat, Visual Disturbances, Wears glasses/contact lenses and Yellow Eyes. Respiratory Not Present- Bloody sputum, Chronic Cough, Difficulty Breathing, Snoring and Wheezing. Cardiovascular Not Present- Chest Pain, Difficulty Breathing Lying Down, Leg Cramps, Palpitations, Rapid Heart Rate, Shortness of Breath and Swelling of Extremities. Gastrointestinal Present- Hemorrhoids and Rectal Pain. Not Present- Abdominal Pain, Bloating, Bloody Stool, Change in Bowel Habits, Chronic diarrhea, Constipation, Difficulty Swallowing, Excessive gas, Gets full quickly at  meals, Indigestion, Nausea and Vomiting. Male Genitourinary Not Present- Blood in Urine, Change in Urinary Stream, Frequency, Impotence, Nocturia, Painful Urination, Urgency and Urine Leakage.  Vitals David Mcintyre CMA; 05/18/2018 9:46  AM) 05/18/2018 9:45 AM Weight: 229.25 lb Height: 68in Body Surface Area: 2.17 m Body Mass Index: 34.86 kg/m  Temp.: 97.58F(Temporal)  Pulse: 93 (Regular)  P.OX: 98% (Room air) BP: 142/80 (Sitting, Left Arm, Standard)      Physical Exam David Hector MD; 05/18/2018 10:16 AM)  General Mental Status-Alert. General Appearance-Not in acute distress, Not Sickly. Orientation-Oriented X3. Hydration-Well hydrated. Voice-Normal.  Integumentary Global Assessment Upon inspection and palpation of skin surfaces of the - Axillae: non-tender, no inflammation or ulceration, no drainage. and Distribution of scalp and body hair is normal. General Characteristics Temperature - normal warmth is noted.  Head and Neck Head-normocephalic, atraumatic with no lesions or palpable masses. Face Global Assessment - atraumatic, no absence of expression. Neck Global Assessment - no abnormal movements, no bruit auscultated on the right, no bruit auscultated on the left, no decreased range of motion, non-tender. Trachea-midline. Thyroid Gland Characteristics - non-tender.  Eye Eyeball - Left-Extraocular movements intact, No Nystagmus. Eyeball - Right-Extraocular movements intact, No Nystagmus. Cornea - Left-No Hazy. Cornea - Right-No Hazy. Sclera/Conjunctiva - Left-No scleral icterus, No Discharge. Sclera/Conjunctiva - Right-No scleral icterus, No Discharge. Pupil - Left-Direct reaction to light normal. Pupil - Right-Direct reaction to light normal.  ENMT Ears Pinna - Left - no drainage observed, no generalized tenderness observed. Right - no drainage observed, no generalized tenderness observed. Nose and  Sinuses External Inspection of the Nose - no destructive lesion observed. Inspection of the nares - Left - quiet respiration. Right - quiet respiration. Mouth and Throat Lips - Upper Lip - no fissures observed, no pallor noted. Lower Lip - no fissures observed, no pallor noted. Nasopharynx - no discharge present. Oral Cavity/Oropharynx - Tongue - no dryness observed. Oral Mucosa - no cyanosis observed. Hypopharynx - no evidence of airway distress observed.  Chest and Lung Exam Inspection Movements - Normal and Symmetrical. Accessory muscles - No use of accessory muscles in breathing. Palpation Palpation of the chest reveals - Non-tender. Auscultation Breath sounds - Normal and Clear.  Cardiovascular Auscultation Rhythm - Regular. Murmurs & Other Heart Sounds - Auscultation of the heart reveals - No Murmurs and No Systolic Clicks.  Abdomen Inspection Inspection of the abdomen reveals - No Visible peristalsis and No Abnormal pulsations. Umbilicus - No Bleeding, No Urine drainage. Palpation/Percussion Palpation and Percussion of the abdomen reveal - Soft, Non Tender, No Rebound tenderness, No Rigidity (guarding) and No Cutaneous hyperesthesia. Note: Abdomen soft. Nontender. Not distended. No umbilical or incisional hernias. Slightly obese with mild diastases recti. Prior laparoscopic David prostatectomy incisions well-healed. Odd oblique scar right upper quadrant from childhood injury. No hernia. No guarding.  Male Genitourinary Sexual Maturity Tanner 5 - Adult hair pattern and Adult penile size and shape. Note: No inguinal hernias. Normal external genitalia. Epididymi, testes, and spermatic cords normal without any masses.  Rectal Note: Please refer to anoscopy section.  Grade 2/3 internal hemorrhoids and right posterior external hemorrhoid.  Peripheral Vascular Upper Extremity Inspection - Left - No Cyanotic nailbeds, Not Ischemic. Right - No Cyanotic nailbeds, Not  Ischemic.  Neurologic Neurologic evaluation reveals -normal attention span and ability to concentrate, able to name objects and repeat phrases. Appropriate fund of knowledge , normal sensation and normal coordination. Mental Status Affect - not angry, not paranoid. Cranial Nerves-Normal Bilaterally. Gait-Normal.  Neuropsychiatric Mental status exam performed with findings of-able to articulate well with normal speech/language, rate, volume and coherence, thought content normal with ability to perform basic computations and apply abstract reasoning and no evidence  of hallucinations, delusions, obsessions or homicidal/suicidal ideation.  Musculoskeletal Global Assessment Spine, Ribs and Pelvis - no instability, subluxation or laxity. Right Upper Extremity - no instability, subluxation or laxity.  Lymphatic Head & Neck  General Head & Neck Lymphatics: Bilateral - Description - No Localized lymphadenopathy. Axillary  General Axillary Region: Bilateral - Description - No Localized lymphadenopathy. Femoral & Inguinal  Generalized Femoral & Inguinal Lymphatics: Left - Description - No Localized lymphadenopathy. Right - Description - No Localized lymphadenopathy.    Assessment & Plan David Hector MD; 05/18/2018 10:17 AM)  EXTERNAL HEMORRHOIDS WITH COMPLICATION (W54.6) Impression: The right posterior internal/external hemorrhoid most likely with some intermittent leakage causing discomfort despite decent bowel regimen.  I don't think this will go away without Mcintyre. Recommend outpatient hemorrhoidal ligation and pexy excise any external components.  The anatomy & physiology of the anorectal region was discussed. The pathophysiology of hemorrhoids and differential diagnosis was discussed. Natural history progression was discussed. I stressed the importance of a bowel regimen to have daily soft bowel movements to minimize progression of disease. Goal of one BM / day ideal. Use  of wet wipes, warm baths, avoiding straining, etc were emphasized.  Educational handouts further explaining the pathology, treatment options, and bowel regimen were given as well. The patient expressed understanding.   PROLAPSED INTERNAL HEMORRHOIDS, GRADE 2 (K64.1) Impression: Enlarged hemorrhoids left lateral right posterior especially with possible some prolapsing. Prepped can be managed with just suture pexy and ligation. Binding concurrent hemorrhoidectomy. Most likely the right posterior pile at least.  Current Plans ANOSCOPY, DIAGNOSTIC (27035) Pt Education - [facility] Hemorrhoids (Adolpho Meenach): discussed with patient and provided information. The anatomy & physiology of the anorectal region was discussed. The pathophysiology of hemorrhoids and differential diagnosis was discussed. Natural history risks without Mcintyre was discussed. I stressed the importance of a bowel regimen to have daily soft bowel movements to minimize progression of disease. Interventions such as sclerotherapy & banding were discussed.  The patient's symptoms are not adequately controlled by medicines and other non-operative treatments. I feel the risks & problems of no Mcintyre outweigh the operative risks; therefore, I recommended Mcintyre to treat the hemorrhoids by ligation, pexy, and possible resection.  Risks such as bleeding, infection, urinary difficulties, need for further treatment, heart attack, death, and other risks were discussed. I noted a good likelihood this will help address the problem. Goals of post-operative recovery were discussed as well. Possibility that this will not correct all symptoms was explained. Post-operative pain, bleeding, constipation, and other problems after Mcintyre were discussed. We will work to minimize complications. Educational handouts further explaining the pathology, treatment options, and bowel regimen were given as well. Questions were answered. The patient expresses  understanding & wishes to proceed with Mcintyre.   ENCOUNTER FOR PREOPERATIVE EXAMINATION FOR GENERAL SURGICAL PROCEDURE (Z01.818)  Current Plans You are being scheduled for Mcintyre- Our schedulers will call you.  You should hear from our office's scheduling department within 5 working days about the location, date, and time of Mcintyre. We try to make accommodations for patient's preferences in scheduling Mcintyre, but sometimes the OR schedule or the surgeon's schedule prevents [place] from making those accommodations.  If you have not heard from our office [phone]) in 5 working days, call the office and ask for your surgeon's nurse.  If you have other questions about your diagnosis, plan, or Mcintyre, call the office and ask for your surgeon's nurse.  The anatomy and the physiology was discussed. The pathophysiology and natural history  of the disease was discussed. Options were discussed and recommendations were made. Technique, risks, benefits, & alternatives were discussed. Risks such as stroke, heart attack, bleeding, indection, death, and other risks discussed. Questions answered. The patient agrees to proceed. Pt Education - [facility] Rectal Prep for Anorectal outpatient/office Mcintyre: discussed with patient and provided information. Pt Education - [facility] Rectal Mcintyre HCI (Akaya Proffit): discussed with patient and provided information. Pt Education - [facility] Good Bowel Health (Shirl Weir)  OSTEOARTHRITIS OF RIGHT KNEE, UNSPECIFIED OSTEOARTHRITIS TYPE (M17.11) Impression: Patient has progressive right knee osteoarthritis and is leaning towards knee replacement diet Dr. Denice Mcintyre were intact. He would like to get the hemorrhoid was first before proceeding with any Mcintyre. I would have the patient weighed about 3 weeks from the hemorrhoid Mcintyre before considering any replacement of material for his knee

## 2018-05-31 NOTE — Patient Instructions (Addendum)
David Mcintyre  05/31/2018   Your procedure is scheduled on: 06-07-18    Report to [facility] Main  Entrance    Report to Admitting at 7:00 AM    Call this number if you have problems the morning of surgery [phone]               Consume a Clear Liquid Diet along with your prep, per your surgeon's instructions    Remember:NO SOLID FOOD AFTER MIDNIGHT THE NIGHT PRIOR TO SURGERY. NOTHING BY MOUTH EXCEPT CLEAR LIQUIDS UNTIL 3 HOURS PRIOR TO SCHEULED SURGERY PER SURGEON ORDER.   CLEAR LIQUID DIET   Foods Allowed                                                                     Foods Excluded  Coffee and tea, regular and decaf                             liquids that you cannot  Plain Jell-O in any flavor                                             see through such as: Fruit ices (not with fruit pulp)                                     milk, soups, orange juice  Iced Popsicles                                    All solid food Carbonated beverages, regular and diet                                    Cranberry, grape and apple juices Sports drinks like Gatorade Lightly seasoned clear broth or consume(fat free) Sugar, honey syrup  Sample Menu Breakfast                                Lunch                                     Supper Cranberry juice                    Beef broth                            Chicken broth Jell-O                                     Grape juice  Apple juice Coffee or tea                        Jell-O                                      Popsicle                                                Coffee or tea                        Coffee or tea  _____________________________________________________________________    BRUSH YOUR TEETH MORNING OF SURGERY AND RINSE YOUR MOUTH OUT, NO CHEWING GUM CANDY OR MINTS.     Take these medicines the morning of surgery with A SIP OF WATER: None                                 You may not have any metal on your body including hair pins and              piercings  Do not wear jewelry, cologne,  lotions, powders or deodorant             Men may shave face and neck.   Do not bring valuables to the hospital. [place].  Contacts, dentures or bridgework may not be worn into surgery.      Patients discharged the day of surgery will not be allowed to drive home. IF YOU ARE HAVING SURGERY AND GOING HOME THE SAME DAY, YOU MUST HAVE AN ADULT TO DRIVE YOU HOME AND BE WITH YOU FOR 24 HOURS. YOU MAY GO HOME BY TAXI OR UBER OR ORTHERWISE, BUT AN ADULT MUST ACCOMPANY YOU HOME AND STAY WITH YOU FOR 24 HOURS.    Name and phone number of your driver: Gabryel Files [phone]  Special Instructions: Follow your prep, per your surgeon's instructions              Please read over the following fact sheets you were given: _____________________________________________________________________ [facility] - Preparing for Surgery Before surgery, you can play an important role.  Because skin is not sterile, your skin needs to be as free of germs as possible.  You can reduce the number of germs on your skin by washing with CHG (chlorahexidine gluconate) soap before surgery.  CHG is an antiseptic cleaner which kills germs and bonds with the skin to continue killing germs even after washing. Please DO NOT use if you have an allergy to CHG or antibacterial soaps.  If your skin becomes reddened/irritated stop using the CHG and inform your nurse when you arrive at Short Stay. Do not shave (including legs and underarms) for at least 48 hours prior to the first CHG shower.  You may shave your face/neck. Please follow these instructions carefully:  1.  Shower with CHG Soap the night before surgery and the  morning of Surgery.  2.  If you choose to wash your hair, wash your hair first as usual with your  normal  shampoo.  3.  After you  shampoo, rinse your hair and body thoroughly to remove the  shampoo.                           4.  Use CHG as you would any other liquid soap.  You can apply chg directly  to the skin and wash                       Gently with a scrungie or clean washcloth.  5.  Apply the CHG Soap to your body ONLY FROM THE NECK DOWN.   Do not use on face/ open                           Wound or open sores. Avoid contact with eyes, ears mouth and genitals (private parts).                       Wash face,  Genitals (private parts) with your normal soap.             6.  Wash thoroughly, paying special attention to the area where your surgery  will be performed.  7.  Thoroughly rinse your body with warm water from the neck down.  8.  DO NOT shower/wash with your normal soap after using and rinsing off  the CHG Soap.                9.  Pat yourself dry with a clean towel.            10.  Wear clean pajamas.            11.  Place clean sheets on your bed the night of your first shower and do not  sleep with pets. Day of Surgery : Do not apply any lotions/deodorants the morning of surgery.  Please wear clean clothes to the hospital/surgery center.  FAILURE TO FOLLOW THESE INSTRUCTIONS MAY RESULT IN THE CANCELLATION OF YOUR SURGERY PATIENT SIGNATURE_________________________________  NURSE SIGNATURE__________________________________  ________________________________________________________________________

## 2018-06-02 ENCOUNTER — Other Ambulatory Visit: Payer: Self-pay

## 2018-06-02 ENCOUNTER — Encounter (HOSPITAL_COMMUNITY)
Admission: RE | Admit: 2018-06-02 | Discharge: 2018-06-02 | Disposition: A | Discharge status: Home / Self Care | Payer: Medicare Other | Source: Ambulatory Visit | Attending: Surgery | Admitting: Surgery

## 2018-06-02 ENCOUNTER — Encounter (HOSPITAL_COMMUNITY): Payer: Self-pay

## 2018-06-02 DIAGNOSIS — I10 Essential (primary) hypertension: Secondary | ICD-10-CM | POA: Insufficient documentation

## 2018-06-02 DIAGNOSIS — K648 Other hemorrhoids: Secondary | ICD-10-CM | POA: Insufficient documentation

## 2018-06-02 DIAGNOSIS — Z01818 Encounter for other preprocedural examination: Secondary | ICD-10-CM | POA: Insufficient documentation

## 2018-06-02 DIAGNOSIS — K644 Residual hemorrhoidal skin tags: Secondary | ICD-10-CM | POA: Insufficient documentation

## 2018-06-02 NOTE — Progress Notes (Addendum)
05-27-18 CBC, CMP, HGA1C and LOV on chart which reflects WNL. EGK noted in LOV as NSR. Info reviewed with [place], no EKG tracing necessary.   Addendum: 'Refusal of All Blood and/or Blood Product' form faxed to surgeon and blood bank, with successful 'fax transmittal on chart.

## 2018-06-03 LAB — NO BLOOD PRODUCTS

## 2018-06-07 ENCOUNTER — Encounter (HOSPITAL_COMMUNITY): Admission: RE | Payer: Self-pay | Source: Home / Self Care

## 2018-06-07 ENCOUNTER — Ambulatory Visit (HOSPITAL_COMMUNITY): Admission: RE | Admit: 2018-06-07 | Payer: Medicare Other | Source: Home / Self Care | Admitting: Surgery

## 2018-06-07 SURGERY — EXAM UNDER ANESTHESIA WITH HEMORRHOIDECTOMY
Anesthesia: General

## 2018-06-11 ENCOUNTER — Ambulatory Visit: Payer: Self-pay | Admitting: Orthopedic Surgery

## 2018-06-15 NOTE — Patient Instructions (Signed)
David Mcintyre  06/15/2018   Your procedure is scheduled on: 06-23-2018    Report to [facility] Main  Entrance     Report to admitting at 6:00AM    Call this number if you have problems the morning of surgery [phone]      Remember: Do not eat food or drink liquids :After Midnight. BRUSH YOUR TEETH MORNING OF SURGERY AND RINSE YOUR MOUTH OUT, NO CHEWING GUM CANDY OR MINTS.     Take these medicines the morning of surgery with A SIP OF WATER: NONE                                You may not have any metal on your body including hair pins and              piercings  Do not wear jewelry, make-up, lotions, powders or perfumes, deodorant             Do not wear nail polish.  Do not shave  48 hours prior to surgery.              Men may shave face and neck.   Do not bring valuables to the hospital. [place].  Contacts, dentures or bridgework may not be worn into surgery.  Leave suitcase in the car. After surgery it may be brought to your room.                   Please read over the following fact sheets you were given: _____________________________________________________________________             [facility] - Preparing for Surgery Before surgery, you can play an important role.  Because skin is not sterile, your skin needs to be as free of germs as possible.  You can reduce the number of germs on your skin by washing with CHG (chlorahexidine gluconate) soap before surgery.  CHG is an antiseptic cleaner which kills germs and bonds with the skin to continue killing germs even after washing. Please DO NOT use if you have an allergy to CHG or antibacterial soaps.  If your skin becomes reddened/irritated stop using the CHG and inform your nurse when you arrive at Short Stay. Do not shave (including legs and underarms) for at least 48 hours prior to the first CHG shower.  You may shave your  face/neck. Please follow these instructions carefully:  1.  Shower with CHG Soap the night before surgery and the  morning of Surgery.  2.  If you choose to wash your hair, wash your hair first as usual with your  normal  shampoo.  3.  After you shampoo, rinse your hair and body thoroughly to remove the  shampoo.                           4.  Use CHG as you would any other liquid soap.  You can apply chg directly  to the skin and wash                       Gently with a scrungie or clean washcloth.  5.  Apply the CHG Soap  to your body ONLY FROM THE NECK DOWN.   Do not use on face/ open                           Wound or open sores. Avoid contact with eyes, ears mouth and genitals (private parts).                       Wash face,  Genitals (private parts) with your normal soap.             6.  Wash thoroughly, paying special attention to the area where your surgery  will be performed.  7.  Thoroughly rinse your body with warm water from the neck down.  8.  DO NOT shower/wash with your normal soap after using and rinsing off  the CHG Soap.                9.  Pat yourself dry with a clean towel.            10.  Wear clean pajamas.            11.  Place clean sheets on your bed the night of your first shower and do not  sleep with pets. Day of Surgery : Do not apply any lotions/deodorants the morning of surgery.  Please wear clean clothes to the hospital/surgery center.  FAILURE TO FOLLOW THESE INSTRUCTIONS MAY RESULT IN THE CANCELLATION OF YOUR SURGERY PATIENT SIGNATURE_________________________________  NURSE SIGNATURE__________________________________  ________________________________________________________________________   David Mcintyre  An incentive spirometer is a tool that can help keep your lungs clear and active. This tool measures how well you are filling your lungs with each breath. Taking long deep breaths may help reverse or decrease the chance of developing breathing  (pulmonary) problems (especially infection) following:  A long period of time when you are unable to move or be active. BEFORE THE PROCEDURE   If the spirometer includes an indicator to show your best effort, your nurse or respiratory therapist will set it to a desired goal.  If possible, sit up straight or lean slightly forward. Try not to slouch.  Hold the incentive spirometer in an upright position. INSTRUCTIONS FOR USE  1. Sit on the edge of your bed if possible, or sit up as far as you can in bed or on a chair. 2. Hold the incentive spirometer in an upright position. 3. Breathe out normally. 4. Place the mouthpiece in your mouth and seal your lips tightly around it. 5. Breathe in slowly and as deeply as possible, raising the piston or the ball toward the top of the column. 6. Hold your breath for 3-5 seconds or for as long as possible. Allow the piston or ball to fall to the bottom of the column. 7. Remove the mouthpiece from your mouth and breathe out normally. 8. Rest for a few seconds and repeat Steps 1 through 7 at least 10 times every 1-2 hours when you are awake. Take your time and take a few normal breaths between deep breaths. 9. The spirometer may include an indicator to show your best effort. Use the indicator as a goal to work toward during each repetition. 10. After each set of 10 deep breaths, practice coughing to be sure your lungs are clear. If you have an incision (the cut made at the time of surgery), support your incision when coughing by placing a pillow or rolled up towels firmly  against it. Once you are able to get out of bed, walk around indoors and cough well. You may stop using the incentive spirometer when instructed by your caregiver.  RISKS AND COMPLICATIONS  Take your time so you do not get dizzy or light-headed.  If you are in pain, you may need to take or ask for pain medication before doing incentive spirometry. It is harder to take a deep breath if you  are having pain. AFTER USE  Rest and breathe slowly and easily.  It can be helpful to keep track of a log of your progress. Your caregiver can provide you with a simple table to help with this. If you are using the spirometer at home, follow these instructions: [place] IF:   You are having difficultly using the spirometer.  You have trouble using the spirometer as often as instructed.  Your pain medication is not giving enough relief while using the spirometer.  You develop fever of 100.5 F (38.1 C) or higher. SEEK IMMEDIATE MEDICAL CARE IF:   You cough up bloody sputum that had not been present before.  You develop fever of 102 F (38.9 C) or greater.  You develop worsening pain at or near the incision site. MAKE SURE YOU:   Understand these instructions.  Will watch your condition.  Will get help right away if you are not doing well or get worse. Document Released: 09/01/2006 Document Revised: 07/14/2011 Document Reviewed: 11/02/2006 Exit[employer] Patient Information 2014 [employer], [place].   ________________________________________________________________________  WHAT IS A BLOOD TRANSFUSION? Blood Transfusion Information  A transfusion is the replacement of blood or some of its parts. Blood is made up of multiple cells which provide different functions.  Red blood cells carry oxygen and are used for blood loss replacement.  White blood cells fight against infection.  Platelets control bleeding.  Plasma helps clot blood.  Other blood products are available for specialized needs, such as hemophilia or other clotting disorders. BEFORE THE TRANSFUSION  Who gives blood for transfusions?   Healthy volunteers who are fully evaluated to make sure their blood is safe. This is blood bank blood. Transfusion therapy is the safest it has ever been in the practice of medicine. Before blood is taken from a donor, a complete history is taken to make sure that person has  no history of diseases nor engages in risky social behavior (examples are intravenous drug use or sexual activity with multiple partners). The donor's travel history is screened to minimize risk of transmitting infections, such as malaria. The donated blood is tested for signs of infectious diseases, such as HIV and hepatitis. The blood is then tested to be sure it is compatible with you in order to minimize the chance of a transfusion reaction. If you or a relative donates blood, this is often done in anticipation of surgery and is not appropriate for emergency situations. It takes many days to process the donated blood. RISKS AND COMPLICATIONS Although transfusion therapy is very safe and saves many lives, the main dangers of transfusion include:   Getting an infectious disease.  Developing a transfusion reaction. This is an allergic reaction to something in the blood you were given. Every precaution is taken to prevent this. The decision to have a blood transfusion has been considered carefully by your caregiver before blood is given. Blood is not given unless the benefits outweigh the risks. AFTER THE TRANSFUSION  Right after receiving a blood transfusion, you will usually feel much better and  more energetic. This is especially true if your red blood cells have gotten low (anemic). The transfusion raises the level of the red blood cells which carry oxygen, and this usually causes an energy increase.  The nurse administering the transfusion will monitor you carefully for complications. HOME CARE INSTRUCTIONS  No special instructions are needed after a transfusion. You may find your energy is better. Speak with your caregiver about any limitations on activity for underlying diseases you may have. SEEK MEDICAL CARE IF:   Your condition is not improving after your transfusion.  You develop redness or irritation at the intravenous (IV) site. SEEK IMMEDIATE MEDICAL CARE IF:  Any of the following  symptoms occur over the next 12 hours:  Shaking chills.  You have a temperature by mouth above 102 F (38.9 C), not controlled by medicine.  Chest, back, or muscle pain.  People around you feel you are not acting correctly or are confused.  Shortness of breath or difficulty breathing.  Dizziness and fainting.  You get a rash or develop hives.  You have a decrease in urine output.  Your urine turns a dark color or changes to pink, red, or brown. Any of the following symptoms occur over the next 10 days:  You have a temperature by mouth above 102 F (38.9 C), not controlled by medicine.  Shortness of breath.  Weakness after normal activity.  The white part of the eye turns yellow (jaundice).  You have a decrease in the amount of urine or are urinating less often.  Your urine turns a dark color or changes to pink, red, or brown. Document Released: 04/18/2000 Document Revised: 07/14/2011 Document Reviewed: 12/06/2007 [facility] Patient Information 2014 [place], [place].  _______________________________________________________________________

## 2018-06-16 ENCOUNTER — Encounter (HOSPITAL_COMMUNITY): Payer: Self-pay

## 2018-06-16 ENCOUNTER — Encounter (HOSPITAL_COMMUNITY)
Admission: RE | Admit: 2018-06-16 | Discharge: 2018-06-16 | Disposition: A | Discharge status: Home / Self Care | Payer: Medicare Other | Source: Ambulatory Visit | Attending: Orthopedic Surgery | Admitting: Orthopedic Surgery

## 2018-06-16 ENCOUNTER — Other Ambulatory Visit: Payer: Self-pay

## 2018-06-16 DIAGNOSIS — I10 Essential (primary) hypertension: Secondary | ICD-10-CM | POA: Insufficient documentation

## 2018-06-16 DIAGNOSIS — M1711 Unilateral primary osteoarthritis, right knee: Secondary | ICD-10-CM | POA: Diagnosis not present

## 2018-06-16 DIAGNOSIS — R9431 Abnormal electrocardiogram [ECG] [EKG]: Secondary | ICD-10-CM | POA: Diagnosis not present

## 2018-06-16 DIAGNOSIS — Z01818 Encounter for other preprocedural examination: Secondary | ICD-10-CM | POA: Diagnosis not present

## 2018-06-16 HISTORY — DX: Unspecified osteoarthritis, unspecified site: M19.90

## 2018-06-16 LAB — BASIC METABOLIC PANEL
Anion gap: 6 (ref 5–15)
BUN: 17 mg/dL (ref 8–23)
CO2: 26 mmol/L (ref 22–32)
Calcium: 9.5 mg/dL (ref 8.9–10.3)
Chloride: 106 mmol/L (ref 98–111)
Creatinine, Ser: 1.21 mg/dL (ref 0.61–1.24)
GFR calc Af Amer: >60 mL/min (ref >60)
GFR calc non Af Amer: >60 mL/min (ref >60)
GLUCOSE: 108 mg/dL — AB (ref 70–99)
Potassium: 4.7 mmol/L (ref 3.5–5.1)
Sodium: 138 mmol/L (ref 135–145)

## 2018-06-16 LAB — NO BLOOD PRODUCTS

## 2018-06-16 LAB — CBC
HCT: 48 % (ref 39.0–52.0)
Hemoglobin: 14.9 g/dL (ref 13.0–17.0)
MCH: 26.7 pg (ref 26.0–34.0)
MCHC: 31 g/dL (ref 30.0–36.0)
MCV: 86 fL (ref 80.0–100.0)
Platelets: 321 10*3/uL (ref 150–400)
RBC: 5.58 MIL/uL (ref 4.22–5.81)
RDW: 14.2 % (ref 11.5–15.5)
WBC: 3.4 10*3/uL — ABNORMAL LOW (ref 4.0–10.5)
nRBC: 0 % (ref 0.0–0.2)

## 2018-06-16 LAB — SURGICAL PCR SCREEN
MRSA, PCR: NEGATIVE
Staphylococcus aureus: NEGATIVE

## 2018-06-21 ENCOUNTER — Ambulatory Visit: Payer: Self-pay | Admitting: Orthopedic Surgery

## 2018-06-21 NOTE — H&P (Signed)
TOTAL KNEE ADMISSION H&P  Patient is being admitted for right total knee arthroplasty.  Subjective:  Chief Complaint:right knee pain.  HPI: David Mcintyre, 68 y.o. male, has a history of pain and functional disability in the right knee due to arthritis and has failed non-surgical conservative treatments for greater than 12 weeks to includeNSAID's and/or analgesics, corticosteriod injections, flexibility and strengthening excercises, use of assistive devices, weight reduction as appropriate and activity modification.  Onset of symptoms was gradual, starting >10 years ago with rapidlly worsening course since that time. The patient noted prior procedures on the knee to include  menisectomy on the right knee(s).  Patient currently rates pain in the right knee(s) at 10 out of 10 with activity. Patient has night pain, worsening of pain with activity and weight bearing, pain that interferes with activities of daily living, pain with passive range of motion, crepitus and joint swelling.  Patient has evidence of subchondral cysts, subchondral sclerosis, periarticular osteophytes and joint space narrowing by imaging studies. There is no active infection.  Patient Active Problem List   Diagnosis Date Noted  . Hemorrhage of rectum and anus 06/14/2013  . Hypercholesterolemia 12/18/2010  . Benign hypertensive heart disease without heart failure 12/18/2010  . Prostate cancer ([place]) 12/18/2010   Past Medical History:  Diagnosis Date  . Arthritis   . Hemorrhoids   . Hypertension    MILD DIASTOLIC HYPERTENSION  . Prostate cancer [facility])     Past Surgical History:  Procedure Laterality Date  . KNEE SURGERY     meniscus   . PROSTATECTOMY     RADICAL PROSTATECTOMY    Current Outpatient Medications  Medication Sig Dispense Refill Last Dose  . OVER THE COUNTER MEDICATION Take 1 Scoop by mouth daily. Vital Reds dietary supplement     . psyllium (METAMUCIL) 58.6 % powder Take 1 packet by mouth daily.       No current facility-administered medications for this visit.    Allergies  Allergen Reactions  . Blood-Group Specific Substance     Social History   Tobacco Use  . Smoking status: Never Smoker  . Smokeless tobacco: Never Used  Substance Use Topics  . Alcohol use: Yes    Alcohol/week: 2.0 standard drinks    Types: 2 Cans of beer per week    Family History  Problem Relation Age of Onset  . Cancer Mother      Review of Systems  Constitutional: Negative.   HENT: Negative.   Eyes: Negative.   Respiratory: Negative.   Cardiovascular: Negative.   Gastrointestinal: Negative.   Genitourinary: Negative.   Musculoskeletal: Positive for joint pain.  Skin: Negative.   Neurological: Negative.   Endo/Heme/Allergies: Negative.   Psychiatric/Behavioral: Negative.     Objective:  Physical Exam  Vitals reviewed. Constitutional: He is oriented to person, place, and time. He appears well-developed and well-nourished.  HENT:  Head: Normocephalic and atraumatic.  Eyes: Pupils are equal, round, and reactive to light. Conjunctivae and EOM are normal.  Neck: Normal range of motion. Neck supple.  Cardiovascular: Normal rate, regular rhythm and intact distal pulses.  Respiratory: Effort normal. No respiratory distress.  GI: Soft. He exhibits no distension.  Genitourinary:    Genitourinary Comments: deferred   Musculoskeletal:     Right knee: He exhibits decreased range of motion, swelling, effusion and abnormal alignment. Tenderness found. Medial joint line and lateral joint line tenderness noted.       Legs:  Neurological: He is alert and oriented to person,  place, and time. He has normal reflexes.  Skin: Skin is warm and dry.  Psychiatric: He has a normal mood and affect. His behavior is normal. Judgment and thought content normal.    Vital signs in last 24 hours: @VSRANGES @  Labs:   Estimated body mass index is 33.66 kg/m as calculated from the following:   Height as of  06/16/18: 5\' 8"  (1.727 m).   Weight as of 06/02/18: 100.4 kg.   Imaging Review Plain radiographs demonstrate severe degenerative joint disease of the right knee(s). The overall alignment issignificant valgus. The bone quality appears to be adequate for age and reported activity level.      Assessment/Plan:  End stage arthritis, right knee   The patient history, physical examination, clinical judgment of the provider and imaging studies are consistent with end stage degenerative joint disease of the right knee(s) and total knee arthroplasty is deemed medically necessary. The treatment options including medical management, injection therapy arthroscopy and arthroplasty were discussed at length. The risks and benefits of total knee arthroplasty were presented and reviewed. The risks due to aseptic loosening, infection, stiffness, patella tracking problems, thromboembolic complications and other imponderables were discussed. The patient acknowledged the explanation, agreed to proceed with the plan and consent was signed. Patient is being admitted for inpatient treatment for surgery, pain control, PT, OT, prophylactic antibiotics, VTE prophylaxis, progressive ambulation and ADL's and discharge planning. The patient is planning to be discharged home with OPPT. Jehovah's witness.      Patient's anticipated LOS is less than 2 midnights, meeting these requirements: - Younger than 28 - Lives within 1 hour of care - Has a competent adult at home to recover with post-op recover - NO history of  - Chronic pain requiring opiods  - Diabetes  - Coronary Artery Disease  - Heart failure  - Heart attack  - Stroke  - DVT/VTE  - Cardiac arrhythmia  - Respiratory Failure/COPD  - Renal failure  - Anemia  - Advanced Liver disease

## 2018-06-21 NOTE — H&P (View-Only) (Signed)
TOTAL KNEE ADMISSION H&P  Patient is being admitted for right total knee arthroplasty.  Subjective:  Chief Complaint:right knee pain.  HPI: David Mcintyre, 68 y.o. male, has a history of pain and functional disability in the right knee due to arthritis and has failed non-surgical conservative treatments for greater than 12 weeks to includeNSAID's and/or analgesics, corticosteriod injections, flexibility and strengthening excercises, use of assistive devices, weight reduction as appropriate and activity modification.  Onset of symptoms was gradual, starting >10 years ago with rapidlly worsening course since that time. The patient noted prior procedures on the knee to include  menisectomy on the right knee(s).  Patient currently rates pain in the right knee(s) at 10 out of 10 with activity. Patient has night pain, worsening of pain with activity and weight bearing, pain that interferes with activities of daily living, pain with passive range of motion, crepitus and joint swelling.  Patient has evidence of subchondral cysts, subchondral sclerosis, periarticular osteophytes and joint space narrowing by imaging studies. There is no active infection.  Patient Active Problem List   Diagnosis Date Noted  . Hemorrhage of rectum and anus 06/14/2013  . Hypercholesterolemia 12/18/2010  . Benign hypertensive heart disease without heart failure 12/18/2010  . Prostate cancer ([place]) 12/18/2010   Past Medical History:  Diagnosis Date  . Arthritis   . Hemorrhoids   . Hypertension    MILD DIASTOLIC HYPERTENSION  . Prostate cancer [facility])     Past Surgical History:  Procedure Laterality Date  . KNEE SURGERY     meniscus   . PROSTATECTOMY     RADICAL PROSTATECTOMY    Current Outpatient Medications  Medication Sig Dispense Refill Last Dose  . OVER THE COUNTER MEDICATION Take 1 Scoop by mouth daily. Vital Reds dietary supplement     . psyllium (METAMUCIL) 58.6 % powder Take 1 packet by mouth daily.       No current facility-administered medications for this visit.    Allergies  Allergen Reactions  . Blood-Group Specific Substance     Social History   Tobacco Use  . Smoking status: Never Smoker  . Smokeless tobacco: Never Used  Substance Use Topics  . Alcohol use: Yes    Alcohol/week: 2.0 standard drinks    Types: 2 Cans of beer per week    Family History  Problem Relation Age of Onset  . Cancer Mother      Review of Systems  Constitutional: Negative.   HENT: Negative.   Eyes: Negative.   Respiratory: Negative.   Cardiovascular: Negative.   Gastrointestinal: Negative.   Genitourinary: Negative.   Musculoskeletal: Positive for joint pain.  Skin: Negative.   Neurological: Negative.   Endo/Heme/Allergies: Negative.   Psychiatric/Behavioral: Negative.     Objective:  Physical Exam  Vitals reviewed. Constitutional: He is oriented to person, place, and time. He appears well-developed and well-nourished.  HENT:  Head: Normocephalic and atraumatic.  Eyes: Pupils are equal, round, and reactive to light. Conjunctivae and EOM are normal.  Neck: Normal range of motion. Neck supple.  Cardiovascular: Normal rate, regular rhythm and intact distal pulses.  Respiratory: Effort normal. No respiratory distress.  GI: Soft. He exhibits no distension.  Genitourinary:    Genitourinary Comments: deferred   Musculoskeletal:     Right knee: He exhibits decreased range of motion, swelling, effusion and abnormal alignment. Tenderness found. Medial joint line and lateral joint line tenderness noted.       Legs:  Neurological: He is alert and oriented to person,  place, and time. He has normal reflexes.  Skin: Skin is warm and dry.  Psychiatric: He has a normal mood and affect. His behavior is normal. Judgment and thought content normal.    Vital signs in last 24 hours: @VSRANGES @  Labs:   Estimated body mass index is 33.66 kg/m as calculated from the following:   Height as of  06/16/18: 5\' 8"  (1.727 m).   Weight as of 06/02/18: 100.4 kg.   Imaging Review Plain radiographs demonstrate severe degenerative joint disease of the right knee(s). The overall alignment issignificant valgus. The bone quality appears to be adequate for age and reported activity level.      Assessment/Plan:  End stage arthritis, right knee   The patient history, physical examination, clinical judgment of the provider and imaging studies are consistent with end stage degenerative joint disease of the right knee(s) and total knee arthroplasty is deemed medically necessary. The treatment options including medical management, injection therapy arthroscopy and arthroplasty were discussed at length. The risks and benefits of total knee arthroplasty were presented and reviewed. The risks due to aseptic loosening, infection, stiffness, patella tracking problems, thromboembolic complications and other imponderables were discussed. The patient acknowledged the explanation, agreed to proceed with the plan and consent was signed. Patient is being admitted for inpatient treatment for surgery, pain control, PT, OT, prophylactic antibiotics, VTE prophylaxis, progressive ambulation and ADL's and discharge planning. The patient is planning to be discharged home with OPPT. Jehovah's witness.      Patient's anticipated LOS is less than 2 midnights, meeting these requirements: - Younger than 39 - Lives within 1 hour of care - Has a competent adult at home to recover with post-op recover - NO history of  - Chronic pain requiring opiods  - Diabetes  - Coronary Artery Disease  - Heart failure  - Heart attack  - Stroke  - DVT/VTE  - Cardiac arrhythmia  - Respiratory Failure/COPD  - Renal failure  - Anemia  - Advanced Liver disease

## 2018-06-22 MED ORDER — BUPIVACAINE LIPOSOME 1.3 % IJ SUSP
20.0000 mL | INTRAMUSCULAR | Status: DC
  Filled 2018-06-22: qty 20

## 2018-06-22 NOTE — Anesthesia Preprocedure Evaluation (Addendum)
Anesthesia Evaluation  Patient identified by MRN, date of birth, ID band Patient awake    Reviewed: Allergy & Precautions, NPO status , Patient's Chart, lab work & pertinent test results  History of Anesthesia Complications Negative for: history of anesthetic complications  Airway Mallampati: II  TM Distance: >3 FB Neck ROM: Full    Dental  (+) Missing, Dental Advisory Given   Pulmonary neg pulmonary ROS,    Pulmonary exam normal        Cardiovascular hypertension, Normal cardiovascular exam     Neuro/Psych negative neurological ROS     GI/Hepatic negative GI ROS, Neg liver ROS,   Endo/Other  negative endocrine ROS  Renal/GU negative Renal ROSProstate Ca     Musculoskeletal negative musculoskeletal ROS (+)   Abdominal   Peds  Hematology negative hematology ROS (+)   Anesthesia Other Findings Day of surgery medications reviewed with the patient.  Reproductive/Obstetrics                            Anesthesia Physical Anesthesia Plan  ASA: II  Anesthesia Plan: Spinal   Post-op Pain Management:  Regional for Post-op pain   Induction:   PONV Risk Score and Plan: 2 and Ondansetron and Propofol infusion  Airway Management Planned: Natural Airway  Additional Equipment:   Intra-op Plan:   Post-operative Plan:   Informed Consent: I have reviewed the patients History and Physical, chart, labs and discussed the procedure including the risks, benefits and alternatives for the proposed anesthesia with the patient or authorized representative who has indicated his/her understanding and acceptance.     Dental advisory given  Plan Discussed with: CRNA and Anesthesiologist  Anesthesia Plan Comments:        Anesthesia Quick Evaluation

## 2018-06-23 ENCOUNTER — Encounter (HOSPITAL_COMMUNITY): Payer: Self-pay | Admitting: *Deleted

## 2018-06-23 ENCOUNTER — Ambulatory Visit (HOSPITAL_COMMUNITY): Payer: Medicare Other | Admitting: Physician Assistant

## 2018-06-23 ENCOUNTER — Other Ambulatory Visit: Payer: Self-pay

## 2018-06-23 ENCOUNTER — Observation Stay (HOSPITAL_COMMUNITY)
Admission: RE | Admit: 2018-06-23 | Discharge: 2018-06-24 | Disposition: A | Discharge status: Home / Self Care | Payer: Medicare Other | Attending: Orthopedic Surgery | Admitting: Orthopedic Surgery

## 2018-06-23 ENCOUNTER — Ambulatory Visit (HOSPITAL_COMMUNITY): Payer: Medicare Other | Admitting: Anesthesiology

## 2018-06-23 ENCOUNTER — Observation Stay (HOSPITAL_COMMUNITY): Payer: Medicare Other

## 2018-06-23 ENCOUNTER — Encounter (HOSPITAL_COMMUNITY)
Admission: RE | Disposition: A | Discharge status: Home / Self Care | Payer: Self-pay | Source: Home / Self Care | Attending: Orthopedic Surgery

## 2018-06-23 DIAGNOSIS — M1711 Unilateral primary osteoarthritis, right knee: Principal | ICD-10-CM | POA: Diagnosis present

## 2018-06-23 DIAGNOSIS — I10 Essential (primary) hypertension: Secondary | ICD-10-CM | POA: Diagnosis not present

## 2018-06-23 DIAGNOSIS — Z8546 Personal history of malignant neoplasm of prostate: Secondary | ICD-10-CM | POA: Diagnosis not present

## 2018-06-23 DIAGNOSIS — Z96651 Presence of right artificial knee joint: Secondary | ICD-10-CM

## 2018-06-23 HISTORY — PX: KNEE ARTHROPLASTY: SHX992

## 2018-06-23 SURGERY — ARTHROPLASTY, KNEE, TOTAL, USING IMAGELESS COMPUTER-ASSISTED NAVIGATION
Anesthesia: Spinal | Laterality: Right

## 2018-06-23 MED ORDER — PROPOFOL 500 MG/50ML IV EMUL
INTRAVENOUS | Status: DC | PRN
  Administered 2018-06-23: 25 ug/kg/min via INTRAVENOUS

## 2018-06-23 MED ORDER — KETOROLAC TROMETHAMINE 30 MG/ML IJ SOLN
INTRAMUSCULAR | Status: AC
  Filled 2018-06-23: qty 1

## 2018-06-23 MED ORDER — ALUM & MAG HYDROXIDE-SIMETH 200-200-20 MG/5ML PO SUSP: 30.0000 mL | ORAL | Status: DC | PRN

## 2018-06-23 MED ORDER — LACTATED RINGERS IV SOLN
INTRAVENOUS | Status: DC
  Administered 2018-06-23 (×3): via INTRAVENOUS

## 2018-06-23 MED ORDER — DEXAMETHASONE SODIUM PHOSPHATE 10 MG/ML IJ SOLN
INTRAMUSCULAR | Status: DC | PRN
  Administered 2018-06-23: 10 mg via INTRAVENOUS

## 2018-06-23 MED ORDER — SODIUM CHLORIDE 0.9 % IV SOLN
INTRAVENOUS | Status: DC | PRN
  Administered 2018-06-23: 25 ug/min via INTRAVENOUS

## 2018-06-23 MED ORDER — TRANEXAMIC ACID-NACL 1000-0.7 MG/100ML-% IV SOLN
1000.0000 mg | INTRAVENOUS | Status: AC
  Administered 2018-06-23: 1000 mg via INTRAVENOUS
  Filled 2018-06-23: qty 100

## 2018-06-23 MED ORDER — METOCLOPRAMIDE HCL 5 MG/ML IJ SOLN: 5.0000 mg | Freq: Three times a day (TID) | INTRAMUSCULAR | Status: DC | PRN

## 2018-06-23 MED ORDER — MIDAZOLAM HCL 2 MG/2ML IJ SOLN
INTRAMUSCULAR | Status: AC
  Administered 2018-06-23: 2 mg via INTRAVENOUS
  Filled 2018-06-23: qty 2

## 2018-06-23 MED ORDER — PROPOFOL 10 MG/ML IV BOLUS
INTRAVENOUS | Status: AC
  Filled 2018-06-23: qty 20

## 2018-06-23 MED ORDER — BUPIVACAINE HCL (PF) 0.25 % IJ SOLN
INTRAMUSCULAR | Status: AC
  Filled 2018-06-23: qty 30

## 2018-06-23 MED ORDER — FENTANYL CITRATE (PF) 100 MCG/2ML IJ SOLN: 25.0000 ug | INTRAMUSCULAR | Status: DC | PRN

## 2018-06-23 MED ORDER — SODIUM CHLORIDE (PF) 0.9 % IJ SOLN
INTRAMUSCULAR | Status: AC
  Filled 2018-06-23: qty 50

## 2018-06-23 MED ORDER — MIDAZOLAM HCL 2 MG/2ML IJ SOLN
INTRAMUSCULAR | Status: AC
  Filled 2018-06-23: qty 2

## 2018-06-23 MED ORDER — PROPOFOL 10 MG/ML IV BOLUS
INTRAVENOUS | Status: AC
  Filled 2018-06-23: qty 60

## 2018-06-23 MED ORDER — BUPIVACAINE-EPINEPHRINE 0.5% -1:200000 IJ SOLN: INTRAMUSCULAR | Status: DC | PRN

## 2018-06-23 MED ORDER — BUPIVACAINE-EPINEPHRINE (PF) 0.5% -1:200000 IJ SOLN
INTRAMUSCULAR | Status: AC
  Filled 2018-06-23: qty 30

## 2018-06-23 MED ORDER — SODIUM CHLORIDE 0.9 % IV SOLN
INTRAVENOUS | Status: DC
  Administered 2018-06-23: 14:00:00 via INTRAVENOUS

## 2018-06-23 MED ORDER — ONDANSETRON HCL 4 MG/2ML IJ SOLN: 4.0000 mg | Freq: Four times a day (QID) | INTRAMUSCULAR | Status: DC | PRN

## 2018-06-23 MED ORDER — ONDANSETRON HCL 4 MG/2ML IJ SOLN
INTRAMUSCULAR | Status: AC
  Filled 2018-06-23: qty 2

## 2018-06-23 MED ORDER — SENNA 8.6 MG PO TABS
1.0000 | ORAL_TABLET | Freq: Two times a day (BID) | ORAL | Status: DC
  Administered 2018-06-23 – 2018-06-24 (×2): 8.6 mg via ORAL
  Filled 2018-06-23 (×2): qty 1

## 2018-06-23 MED ORDER — STERILE WATER FOR IRRIGATION IR SOLN
Status: DC | PRN
  Administered 2018-06-23: 2000 mL

## 2018-06-23 MED ORDER — PHENYLEPHRINE HCL 10 MG/ML IJ SOLN
INTRAMUSCULAR | Status: AC
  Filled 2018-06-23: qty 1

## 2018-06-23 MED ORDER — MENTHOL 3 MG MT LOZG: 1.0000 | LOZENGE | OROMUCOSAL | Status: DC | PRN

## 2018-06-23 MED ORDER — ACETAMINOPHEN 10 MG/ML IV SOLN
1000.0000 mg | Freq: Once | INTRAVENOUS | Status: AC
  Administered 2018-06-23: 1000 mg via INTRAVENOUS

## 2018-06-23 MED ORDER — DEXAMETHASONE SODIUM PHOSPHATE 10 MG/ML IJ SOLN
10.0000 mg | Freq: Once | INTRAMUSCULAR | Status: AC
  Administered 2018-06-24: 10 mg via INTRAVENOUS
  Filled 2018-06-23: qty 1

## 2018-06-23 MED ORDER — FENTANYL CITRATE (PF) 100 MCG/2ML IJ SOLN
50.0000 ug | INTRAMUSCULAR | Status: DC
  Administered 2018-06-23: 100 ug via INTRAVENOUS

## 2018-06-23 MED ORDER — DEXAMETHASONE SODIUM PHOSPHATE 10 MG/ML IJ SOLN
INTRAMUSCULAR | Status: AC
  Filled 2018-06-23: qty 1

## 2018-06-23 MED ORDER — ACETAMINOPHEN 325 MG PO TABS: 325.0000 mg | ORAL_TABLET | Freq: Four times a day (QID) | ORAL | Status: DC | PRN

## 2018-06-23 MED ORDER — DIPHENHYDRAMINE HCL 12.5 MG/5ML PO ELIX: 12.5000 mg | ORAL_SOLUTION | ORAL | Status: DC | PRN

## 2018-06-23 MED ORDER — CEFAZOLIN SODIUM-DEXTROSE 2-4 GM/100ML-% IV SOLN
2.0000 g | INTRAVENOUS | Status: AC
  Administered 2018-06-23: 2 g via INTRAVENOUS
  Filled 2018-06-23: qty 100

## 2018-06-23 MED ORDER — ASPIRIN 81 MG PO CHEW
81.0000 mg | CHEWABLE_TABLET | Freq: Two times a day (BID) | ORAL | Status: DC
  Administered 2018-06-23 – 2018-06-24 (×2): 81 mg via ORAL
  Filled 2018-06-23 (×2): qty 1

## 2018-06-23 MED ORDER — BUPIVACAINE-EPINEPHRINE (PF) 0.5% -1:200000 IJ SOLN
INTRAMUSCULAR | Status: DC | PRN
  Administered 2018-06-23: 30 mL via PERINEURAL

## 2018-06-23 MED ORDER — METHOCARBAMOL 500 MG PO TABS
500.0000 mg | ORAL_TABLET | Freq: Four times a day (QID) | ORAL | Status: DC | PRN
  Administered 2018-06-23: 500 mg via ORAL
  Filled 2018-06-23: qty 1

## 2018-06-23 MED ORDER — GLYCOPYRROLATE 0.2 MG/ML IJ SOLN
INTRAMUSCULAR | Status: DC | PRN
  Administered 2018-06-23: 0.1 mg via INTRAVENOUS

## 2018-06-23 MED ORDER — MIDAZOLAM HCL 2 MG/2ML IJ SOLN
1.0000 mg | INTRAMUSCULAR | Status: DC
  Administered 2018-06-23: 2 mg via INTRAVENOUS

## 2018-06-23 MED ORDER — SODIUM CHLORIDE (PF) 0.9 % IJ SOLN
INTRAMUSCULAR | Status: DC | PRN
  Administered 2018-06-23: 30 mL

## 2018-06-23 MED ORDER — BUPIVACAINE IN DEXTROSE 0.75-8.25 % IT SOLN
INTRATHECAL | Status: DC | PRN
  Administered 2018-06-23: 1.6 mL via INTRATHECAL

## 2018-06-23 MED ORDER — SODIUM CHLORIDE 0.9 % IV SOLN: INTRAVENOUS | Status: DC

## 2018-06-23 MED ORDER — BUPIVACAINE HCL 0.25 % IJ SOLN
INTRAMUSCULAR | Status: DC | PRN
  Administered 2018-06-23: 30 mL

## 2018-06-23 MED ORDER — ISOPROPYL ALCOHOL 70 % SOLN
Status: DC | PRN
  Administered 2018-06-23: 1 via TOPICAL

## 2018-06-23 MED ORDER — FENTANYL CITRATE (PF) 100 MCG/2ML IJ SOLN
INTRAMUSCULAR | Status: AC
  Administered 2018-06-23: 100 ug via INTRAVENOUS
  Filled 2018-06-23: qty 2

## 2018-06-23 MED ORDER — FENTANYL CITRATE (PF) 100 MCG/2ML IJ SOLN
INTRAMUSCULAR | Status: DC | PRN
  Administered 2018-06-23 (×2): 50 ug via INTRAVENOUS

## 2018-06-23 MED ORDER — DOCUSATE SODIUM 100 MG PO CAPS
100.0000 mg | ORAL_CAPSULE | Freq: Two times a day (BID) | ORAL | Status: DC
  Administered 2018-06-23 – 2018-06-24 (×2): 100 mg via ORAL
  Filled 2018-06-23 (×2): qty 1

## 2018-06-23 MED ORDER — ACETAMINOPHEN 500 MG PO TABS
1000.0000 mg | ORAL_TABLET | Freq: Once | ORAL | Status: AC
  Administered 2018-06-23: 1000 mg via ORAL
  Filled 2018-06-23: qty 2

## 2018-06-23 MED ORDER — METOCLOPRAMIDE HCL 5 MG PO TABS: 5.0000 mg | ORAL_TABLET | Freq: Three times a day (TID) | ORAL | Status: DC | PRN

## 2018-06-23 MED ORDER — FENTANYL CITRATE (PF) 100 MCG/2ML IJ SOLN
INTRAMUSCULAR | Status: AC
  Filled 2018-06-23: qty 2

## 2018-06-23 MED ORDER — KETOROLAC TROMETHAMINE 15 MG/ML IJ SOLN
7.5000 mg | Freq: Four times a day (QID) | INTRAMUSCULAR | Status: AC
  Administered 2018-06-23 – 2018-06-24 (×4): 7.5 mg via INTRAVENOUS
  Filled 2018-06-23 (×4): qty 1

## 2018-06-23 MED ORDER — PROMETHAZINE HCL 25 MG/ML IJ SOLN: 6.2500 mg | INTRAMUSCULAR | Status: DC | PRN

## 2018-06-23 MED ORDER — ONDANSETRON HCL 4 MG PO TABS: 4.0000 mg | ORAL_TABLET | Freq: Four times a day (QID) | ORAL | Status: DC | PRN

## 2018-06-23 MED ORDER — PHENOL 1.4 % MT LIQD: 1.0000 | OROMUCOSAL | Status: DC | PRN

## 2018-06-23 MED ORDER — SODIUM CHLORIDE 0.9 % IR SOLN
Status: DC | PRN
  Administered 2018-06-23: 3000 mL
  Administered 2018-06-23: 1000 mL

## 2018-06-23 MED ORDER — ACETAMINOPHEN 10 MG/ML IV SOLN
INTRAVENOUS | Status: AC
  Filled 2018-06-23: qty 100

## 2018-06-23 MED ORDER — MORPHINE SULFATE (PF) 4 MG/ML IV SOLN: 0.5000 mg | INTRAVENOUS | Status: DC | PRN

## 2018-06-23 MED ORDER — POVIDONE-IODINE 10 % EX SWAB
2.0000 "application " | Freq: Once | CUTANEOUS | Status: AC
  Administered 2018-06-23: 2 via TOPICAL

## 2018-06-23 MED ORDER — METHOCARBAMOL 500 MG IVPB - SIMPLE MED
500.0000 mg | Freq: Four times a day (QID) | INTRAVENOUS | Status: DC | PRN
  Filled 2018-06-23: qty 50

## 2018-06-23 MED ORDER — KETOROLAC TROMETHAMINE 30 MG/ML IJ SOLN
INTRAMUSCULAR | Status: DC | PRN
  Administered 2018-06-23: 30 mg

## 2018-06-23 MED ORDER — CHLORHEXIDINE GLUCONATE 4 % EX LIQD: 60.0000 mL | Freq: Once | CUTANEOUS | Status: DC

## 2018-06-23 MED ORDER — HYDROCODONE-ACETAMINOPHEN 5-325 MG PO TABS
1.0000 | ORAL_TABLET | ORAL | Status: DC | PRN
  Administered 2018-06-23: 2 via ORAL
  Administered 2018-06-23: 1 via ORAL
  Administered 2018-06-24: 2 via ORAL
  Filled 2018-06-23: qty 1
  Filled 2018-06-23 (×2): qty 2

## 2018-06-23 MED ORDER — HYDROCODONE-ACETAMINOPHEN 7.5-325 MG PO TABS: 1.0000 | ORAL_TABLET | ORAL | Status: DC | PRN

## 2018-06-23 MED ORDER — CEFAZOLIN SODIUM-DEXTROSE 2-4 GM/100ML-% IV SOLN
2.0000 g | Freq: Four times a day (QID) | INTRAVENOUS | Status: AC
  Administered 2018-06-23 (×2): 2 g via INTRAVENOUS
  Filled 2018-06-23 (×2): qty 100

## 2018-06-23 MED ORDER — MIDAZOLAM HCL 5 MG/5ML IJ SOLN
INTRAMUSCULAR | Status: DC | PRN
  Administered 2018-06-23: 2 mg via INTRAVENOUS

## 2018-06-23 MED ORDER — ONDANSETRON HCL 4 MG/2ML IJ SOLN
INTRAMUSCULAR | Status: DC | PRN
  Administered 2018-06-23: 4 mg via INTRAVENOUS

## 2018-06-23 MED ORDER — GLYCOPYRROLATE PF 0.2 MG/ML IJ SOSY
PREFILLED_SYRINGE | INTRAMUSCULAR | Status: AC
  Filled 2018-06-23: qty 1

## 2018-06-23 MED ORDER — POLYETHYLENE GLYCOL 3350 17 G PO PACK: 17.0000 g | PACK | Freq: Every day | ORAL | Status: DC | PRN

## 2018-06-23 SURGICAL SUPPLY — 65 items
BAG ZIPLOCK 12X15 (MISCELLANEOUS) IMPLANT
BANDAGE ACE 4X5 VEL STRL LF (GAUZE/BANDAGES/DRESSINGS) ×3 IMPLANT
BANDAGE ACE 6X5 VEL STRL LF (GAUZE/BANDAGES/DRESSINGS) ×3 IMPLANT
BATTERY INSTRU NAVIGATION (MISCELLANEOUS) ×9 IMPLANT
BLADE SAW RECIPROCATING 77.5 (BLADE) ×3 IMPLANT
CHLORAPREP W/TINT 26ML (MISCELLANEOUS) ×6 IMPLANT
COMPONENT TRI CR FEM SZ5 KNEE (Orthopedic Implant) ×1 IMPLANT
COVER SURGICAL LIGHT HANDLE (MISCELLANEOUS) ×3 IMPLANT
COVER WAND RF STERILE (DRAPES) IMPLANT
CUFF TOURN SGL QUICK 34 (TOURNIQUET CUFF) ×2
CUFF TRNQT CYL 34X4.125X (TOURNIQUET CUFF) ×1 IMPLANT
DECANTER SPIKE VIAL GLASS SM (MISCELLANEOUS) ×6 IMPLANT
DERMABOND ADVANCED (GAUZE/BANDAGES/DRESSINGS) ×4
DERMABOND ADVANCED .7 DNX12 (GAUZE/BANDAGES/DRESSINGS) ×2 IMPLANT
DRAPE SHEET LG 3/4 BI-LAMINATE (DRAPES) ×9 IMPLANT
DRAPE U-SHAPE 47X51 STRL (DRAPES) ×3 IMPLANT
DRSG AQUACEL AG ADV 3.5X10 (GAUZE/BANDAGES/DRESSINGS) ×3 IMPLANT
DRSG TEGADERM 4X4.75 (GAUZE/BANDAGES/DRESSINGS) IMPLANT
ELECT BLADE TIP CTD 4 INCH (ELECTRODE) ×3 IMPLANT
ELECT REM PT RETURN 15FT ADLT (MISCELLANEOUS) ×3 IMPLANT
EVACUATOR 1/8 PVC DRAIN (DRAIN) IMPLANT
GAUZE SPONGE 4X4 12PLY STRL (GAUZE/BANDAGES/DRESSINGS) ×3 IMPLANT
GLOVE BIO SURGEON STRL SZ8.5 (GLOVE) ×6 IMPLANT
GLOVE BIOGEL PI IND STRL 8.5 (GLOVE) ×1 IMPLANT
GLOVE BIOGEL PI INDICATOR 8.5 (GLOVE) ×2
GOWN SPEC L3 XXLG W/TWL (GOWN DISPOSABLE) ×3 IMPLANT
HANDPIECE INTERPULSE COAX TIP (DISPOSABLE) ×2
HOLDER FOLEY CATH W/STRAP (MISCELLANEOUS) ×3 IMPLANT
HOOD PEEL AWAY FLYTE STAYCOOL (MISCELLANEOUS) ×12 IMPLANT
INSERT TIB 5 9XBRNG CRCTE RTN (Insert) ×1 IMPLANT
KNEE INSERT TIB BRG (Insert) ×2 IMPLANT
KNEE TIBIAL COMPONENT SZ5 (Knees) ×3 IMPLANT
MARKER SKIN DUAL TIP RULER LAB (MISCELLANEOUS) ×3 IMPLANT
NDL SAFETY ECLIPSE 18X1.5 (NEEDLE) ×1 IMPLANT
NEEDLE HYPO 18GX1.5 SHARP (NEEDLE) ×2
NEEDLE SPNL 18GX3.5 QUINCKE PK (NEEDLE) ×3 IMPLANT
NS IRRIG 1000ML POUR BTL (IV SOLUTION) ×3 IMPLANT
PACK TOTAL KNEE CUSTOM (KITS) ×3 IMPLANT
PADDING CAST COTTON 6X4 STRL (CAST SUPPLIES) ×3 IMPLANT
PATELLA ASYMMETRIC 38X11 KNEE (Orthopedic Implant) ×3 IMPLANT
PROTECTOR NERVE ULNAR (MISCELLANEOUS) ×3 IMPLANT
SAW OSC TIP CART 19.5X105X1.3 (SAW) ×3 IMPLANT
SEALER BIPOLAR AQUA 6.0 (INSTRUMENTS) ×3 IMPLANT
SET HNDPC FAN SPRY TIP SCT (DISPOSABLE) ×1 IMPLANT
SET PAD KNEE POSITIONER (MISCELLANEOUS) ×3 IMPLANT
SPONGE DRAIN TRACH 4X4 STRL 2S (GAUZE/BANDAGES/DRESSINGS) IMPLANT
SPONGE LAP 18X18 RF (DISPOSABLE) IMPLANT
SUT MNCRL AB 3-0 PS2 18 (SUTURE) ×3 IMPLANT
SUT MNCRL AB 4-0 PS2 18 (SUTURE) ×3 IMPLANT
SUT MON AB 2-0 CT1 36 (SUTURE) ×6 IMPLANT
SUT STRATAFIX PDO 1 14 VIOLET (SUTURE) ×2
SUT STRATFX PDO 1 14 VIOLET (SUTURE) ×1
SUT VIC AB 1 CTX 36 (SUTURE) ×4
SUT VIC AB 1 CTX36XBRD ANBCTR (SUTURE) ×2 IMPLANT
SUT VIC AB 2-0 CT1 27 (SUTURE) ×2
SUT VIC AB 2-0 CT1 TAPERPNT 27 (SUTURE) ×1 IMPLANT
SUTURE STRATFX PDO 1 14 VIOLET (SUTURE) ×1 IMPLANT
SYR 3ML LL SCALE MARK (SYRINGE) ×6 IMPLANT
SYR 50ML LL SCALE MARK (SYRINGE) ×3 IMPLANT
TOWER CARTRIDGE SMART MIX (DISPOSABLE) IMPLANT
TRAY FOLEY MTR SLVR 16FR STAT (SET/KITS/TRAYS/PACK) ×3 IMPLANT
TRI CRUC RET FEM SZ5 KNEE (Orthopedic Implant) ×3 IMPLANT
WATER STERILE IRR 1000ML POUR (IV SOLUTION) ×6 IMPLANT
WRAP KNEE MAXI GEL POST OP (GAUZE/BANDAGES/DRESSINGS) ×3 IMPLANT
YANKAUER SUCT BULB TIP 10FT TU (MISCELLANEOUS) ×3 IMPLANT

## 2018-06-23 NOTE — Evaluation (Signed)
Physical Therapy Evaluation Patient Details Name: David Mcintyre MRN: [id number] DOB: 03/01/1951 Today's Date: 06/23/2018   History of Present Illness  Pt s/p R TKR  Clinical Impression  Pt s/p R TKR and presents with decreased R LE strength/ROM and post op pain limiting functional mobility.  Pt should progress to dc home with family assist and has first OP appt scheduled for 06/29/18.    Follow Up Recommendations Follow surgeon's recommendation for DC plan and follow-up therapies    Equipment Recommendations  None recommended by PT    Recommendations for Other Services OT consult     Precautions / Restrictions Precautions Precautions: Knee;Fall Restrictions Weight Bearing Restrictions: No Other Position/Activity Restrictions: WBAT      Mobility  Bed Mobility Overal bed mobility: Needs Assistance Bed Mobility: Supine to Sit     Supine to sit: Min assist     General bed mobility comments: cues for sequence and use of L LE to self assist  Transfers Overall transfer level: Needs assistance Equipment used: Rolling walker (2 wheeled) Transfers: Sit to/from Stand Sit to Stand: Min guard         General transfer comment: cues for LE management and use of UEs to self assist  Ambulation/Gait Ambulation/Gait assistance: Min assist;Min guard Gait Distance (Feet): 75 Feet Assistive device: Rolling walker (2 wheeled) Gait Pattern/deviations: Step-to pattern;Step-through pattern;Decreased step length - right;Decreased step length - left;Shuffle;Trunk flexed Gait velocity: decr   General Gait Details: cues for posture, position from RW and initial sequence  Stairs            Wheelchair Mobility    Modified Rankin (Stroke Patients Only)       Balance Overall balance assessment: Mild deficits observed, not formally tested                                           Pertinent Vitals/Pain Pain Assessment: 0-10 Pain Score: 6  Pain Location:  R knee Pain Descriptors / Indicators: Aching;Sore Pain Intervention(s): Limited activity within patient's tolerance;Monitored during session;Premedicated before session;Ice applied    Home Living Family/patient expects to be discharged to:: Private residence Living Arrangements: Alone Available Help at Discharge: Family Type of Home: House Home Access: Stairs to enter Entrance Stairs-Rails: None Entrance Stairs-Number of Steps: 2 Home Layout: One level Home Equipment: Environmental consultant - 2 wheels;Bedside commode      Prior Function Level of Independence: Independent               Hand Dominance   Dominant Hand: Right    Extremity/Trunk Assessment   Upper Extremity Assessment Upper Extremity Assessment: Overall WFL for tasks assessed    Lower Extremity Assessment Lower Extremity Assessment: RLE deficits/detail    Cervical / Trunk Assessment Cervical / Trunk Assessment: Normal  Communication   Communication: No difficulties  Cognition Arousal/Alertness: Awake/alert Behavior During Therapy: WFL for tasks assessed/performed Overall Cognitive Status: Within Functional Limits for tasks assessed                                        General Comments      Exercises Total Joint Exercises Ankle Circles/Pumps: AROM;Both;15 reps;Supine   Assessment/Plan    PT Assessment Patient needs continued PT services  PT Problem List Decreased strength;Decreased range of motion;Decreased activity tolerance;Decreased mobility;Decreased  knowledge of use of DME;Pain       PT Treatment Interventions DME instruction;Gait training;Stair training;Functional mobility training;Therapeutic activities;Therapeutic exercise;Patient/family education    PT Goals (Current goals can be found in the Care Plan section)  Acute Rehab PT Goals Patient Stated Goal: Regain IND PT Goal Formulation: With patient Time For Goal Achievement: 06/30/18 Potential to Achieve Goals: Good     Frequency 7X/week   Barriers to discharge        Co-evaluation               AM-PAC PT "6 Clicks" Mobility  Outcome Measure                  End of Session Equipment Utilized During Treatment: Gait belt Activity Tolerance: Patient tolerated treatment well Patient left: in chair;with call bell/phone within reach;with chair alarm set Nurse Communication: Mobility status PT Visit Diagnosis: Difficulty in walking, not elsewhere classified (R26.2)    Time: 1540-1605 PT Time Calculation (min) (ACUTE ONLY): 25 min   Charges:   PT Evaluation $PT Eval Low Complexity: 1 Low PT Treatments $Gait Training: 8-22 mins        [place] Pager [phone] Office [phone]   Krissa Utke 06/23/2018, 4:25 PM

## 2018-06-23 NOTE — Anesthesia Procedure Notes (Addendum)
Spinal  Patient location during procedure: OR Start time: 06/23/2018 8:34 AM End time: 06/23/2018 8:33 AM Staffing Anesthesiologist: Duane Boston, MD Performed: anesthesiologist  Preanesthetic Checklist Completed: patient identified, surgical consent, pre-op evaluation, timeout performed, IV checked, risks and benefits discussed and monitors and equipment checked Spinal Block Patient position: sitting Prep: DuraPrep Patient monitoring: cardiac monitor, continuous pulse ox and blood pressure Approach: midline Location: L2-3 Injection technique: single-shot Needle Needle type: Pencan  Needle gauge: 24 G Needle length: 9 cm Additional Notes Functioning IV was confirmed and monitors were applied. Sterile prep and drape, including hand hygiene and sterile gloves were used. The patient was positioned and the spine was prepped. The skin was anesthetized with lidocaine.  Free flow of clear CSF was obtained prior to injecting local anesthetic into the CSF.  The spinal needle aspirated freely following injection.  The needle was carefully withdrawn.  The patient tolerated the procedure well.

## 2018-06-23 NOTE — Op Note (Signed)
OPERATIVE REPORT  SURGEON: Rod Can, MD   ASSISTANT: Sherlean Foot, RNFA.  PREOPERATIVE DIAGNOSIS: Right knee arthritis.   POSTOPERATIVE DIAGNOSIS: Right knee arthritis.   PROCEDURE: Right total knee arthroplasty.   IMPLANTS: Stryker Triathlon CR femur, size 5. Stryker Tritanium tibia, size 5. X3 polyethelyene insert, size 9 mm, CR. 3 button asymmetric patella, size 38 mm.  ANESTHESIA:  MAC, Regional and Spinal  TOURNIQUET TIME: Not utilized.   ESTIMATED BLOOD LOSS:-250 mL    ANTIBIOTICS: 2 g Ancef.  DRAINS: None.  COMPLICATIONS: None   CONDITION: PACU - hemodynamically stable.   BRIEF CLINICAL NOTE: David Mcintyre is a 68 y.o. male with a long-standing history of Right knee arthritis. After failing conservative management, the patient was indicated for total knee arthroplasty. The risks, benefits, and alternatives to the procedure were explained, and the patient elected to proceed.  PROCEDURE IN DETAIL: Adductor canal block was obtained in the pre-op holding area. Once inside the operative room, spinal anesthesia was obtained, and a foley catheter was inserted. The patient was then positioned, a nonsterile tourniquet was placed, and the lower extremity was prepped and draped in the normal sterile surgical fashion.  A time-out was called verifying side and site of surgery. The patient received IV antibiotics within 60 minutes of beginning the procedure. The tourniquet was not utilized.   An anterior approach to the knee was performed utilizing a midvastus arthrotomy. A medial release was performed and the patellar fat pad was excised. Stryker navigation was used to cut the distal femur perpendicular to the mechanical axis. A freehand patellar resection was performed, and the patella was sized an prepared with 3 lug holes.  Nagivation was used to make a neutral proximal  tibia resection, taking 3 mm of bone from the less affected medial side with 3 degrees of slope. The menisci were excised. A spacer block was placed, and the alignment and balance in extension were confirmed.   The distal femur was sized using the 3-degree external rotation guide referencing the posterior femoral cortex. The appropriate 4-in-1 cutting block was pinned into place. Rotation was checked using Whiteside's line, the epicondylar axis, and then confirmed with a spacer block in flexion. The remaining femoral cuts were performed, taking care to protect the MCL.  The tibia was sized and the trial tray was pinned into place. The remaining trail components were inserted. The knee was stable to varus and valgus stress through a full range of motion. The patella tracked centrally, and the PCL was well balanced. The trial components were removed, and the proximal tibial surface was prepared. Final components were impacted into place. The knee was tested for a final time and found to be well balanced.   The wound was copiously irrigated with normal saline using pulse lavage.  Marcaine solution was injected into the periarticular soft tissue.  The wound was closed in layers using #1 Vicryl and Stratafix for the fascia, 2-0 Vicryl for the subcutaneous fat, 2-0 Monocryl for the deep dermal layer, 3-0 running Monocryl subcuticular Stitch, and 4-0 Monocryl stay sutures at both ends of the wound. Dermabond was applied to the skin.  Once the glue was fully dried, an Aquacell Ag and compressive dressing were applied.  Tthe patient was transported to the recovery room in stable condition.  Sponge, needle, and instrument counts were correct at the end of the case x2.  The patient tolerated the procedure well and there were no known complications.  Please note that a surgical assistant  was a medical necessity for this procedure in order to perform it in a safe and expeditious manner. Surgical assistant was necessary  to retract the ligaments and vital neurovascular structures to prevent injury to them and also necessary for proper positioning of the limb to allow for anatomic placement of the prosthesis.

## 2018-06-23 NOTE — Transfer of Care (Signed)
Immediate Anesthesia Transfer of Care Note  Patient: David Mcintyre  Procedure(s) Performed: Procedure(s): COMPUTER ASSISTED TOTAL KNEE ARTHROPLASTY (Right)  Patient Location: PACU  Anesthesia Type:Spinal  Level of Consciousness:  sedated, patient cooperative and responds to stimulation  Airway & Oxygen Therapy:Patient Spontanous Breathing and Patient connected to face mask oxgen  Post-op Assessment:  Report given to PACU RN and Post -op Vital signs reviewed and stable  Post vital signs:  Reviewed and stable  Last Vitals:  Vitals:   06/23/18 0801 06/23/18 0802  BP:    Pulse: 71 73  Resp: 15 (!) 8  Temp:    SpO2: 94% 70%    Complications: No apparent anesthesia complications

## 2018-06-23 NOTE — Discharge Instructions (Signed)
° °Dr. Merick Kelleher °Total Joint Specialist °[facility] °[address] °[place], [place] [address] °[phone] ° °TOTAL KNEE REPLACEMENT POSTOPERATIVE DIRECTIONS ° ° ° °Knee Rehabilitation, Guidelines Following Surgery  °Results after knee surgery are often greatly improved when you follow the exercise, range of motion and muscle strengthening exercises prescribed by your doctor. Safety measures are also important to protect the knee from further injury. Any time any of these exercises cause you to have increased pain or swelling in your knee joint, decrease the amount until you are comfortable again and slowly increase them. If you have problems or questions, call your caregiver or physical therapist for advice.  ° °WEIGHT BEARING °Weight bearing as tolerated with assist device (walker, cane, etc) as directed, use it as long as suggested by your surgeon or therapist, typically at least 4-6 weeks. ° °HOME CARE INSTRUCTIONS  °Remove items at home which could result in a fall. This includes throw rugs or furniture in walking pathways.  °Continue medications as instructed at time of discharge. °You may have some home medications which will be placed on hold until you complete the course of blood thinner medication.  °You may start showering once you are discharged home but do not submerge the incision under water. Just pat the incision dry and apply a dry gauze dressing on daily. °Walk with walker as instructed.  °You may resume a sexual relationship in one month or when given the OK by your doctor.  °· Use walker as long as suggested by your caregivers. °· Avoid periods of inactivity such as sitting longer than an hour when not asleep. This helps prevent blood clots.  °You may put full weight on your legs and walk as much as is comfortable.  °You may return to work once you are cleared by your doctor.  °Do not drive a car for 6 weeks or until released by you surgeon.  °· Do not drive  while taking narcotics.  °Wear the elastic stockings for three weeks following surgery during the day but you may remove then at night. °Make sure you keep all of your appointments after your operation with all of your doctors and caregivers. You should call the office at the above phone number and make an appointment for approximately two weeks after the date of your surgery. °Do not remove your surgical dressing. The dressing is waterproof; you may take showers in 3 days, but do not take tub baths or submerge the dressing. °Please pick up a stool softener and laxative for home use as long as you are requiring pain medications. °· ICE to the affected knee every three hours for 30 minutes at a time and then as needed for pain and swelling.  Continue to use ice on the knee for pain and swelling from surgery. You may notice swelling that will progress down to the foot and ankle.  This is normal after surgery.  Elevate the leg when you are not up walking on it.   °It is important for you to complete the blood thinner medication as prescribed by your doctor. °· Continue to use the breathing machine which will help keep your temperature down.  It is common for your temperature to cycle up and down following surgery, especially at night when you are not up moving around and exerting yourself.  The breathing machine keeps your lungs expanded and your temperature down. ° °RANGE OF MOTION AND STRENGTHENING EXERCISES  °Rehabilitation of the knee is important following   a knee injury or an operation. After just a few days of immobilization, the muscles of the thigh which control the knee become weakened and shrink (atrophy). Knee exercises are designed to build up the tone and strength of the thigh muscles and to improve knee motion. Often times heat used for twenty to thirty minutes before working out will loosen up your tissues and help with improving the range of motion but do not use heat for the first two weeks following  surgery. These exercises can be done on a training (exercise) mat, on the floor, on a table or on a bed. Use what ever works the best and is most comfortable for you Knee exercises include:  °Leg Lifts - While your knee is still immobilized in a splint or cast, you can do straight leg raises. Lift the leg to 60 degrees, hold for 3 sec, and slowly lower the leg. Repeat 10-20 times 2-3 times daily. Perform this exercise against resistance later as your knee gets better.  °Quad and Hamstring Sets - Tighten up the muscle on the front of the thigh (Quad) and hold for 5-10 sec. Repeat this 10-20 times hourly. Hamstring sets are done by pushing the foot backward against an object and holding for 5-10 sec. Repeat as with quad sets.  °A rehabilitation program following serious knee injuries can speed recovery and prevent re-injury in the future due to weakened muscles. Contact your doctor or a physical therapist for more information on knee rehabilitation.  ° °SKILLED REHAB INSTRUCTIONS: °If the patient is transferred to a skilled rehab facility following release from the hospital, a list of the current medications will be sent to the facility for the patient to continue.  When discharged from the skilled rehab facility, please have the facility set up the patient's Home Health Physical Therapy prior to being released. Also, the skilled facility will be responsible for providing the patient with their medications at time of release from the facility to include their pain medication, the muscle relaxants, and their blood thinner medication. If the patient is still at the rehab facility at time of the two week follow up appointment, the skilled rehab facility will also need to assist the patient in arranging follow up appointment in our office and any transportation needs. ° °MAKE SURE YOU:  °Understand these instructions.  °Will watch your condition.  °Will get help right away if you are not doing well or get worse.   ° ° °Pick up stool softner and laxative for home use following surgery while on pain medications. °Do NOT remove your dressing. You may shower.  °Do not take tub baths or submerge incision under water. °May shower starting three days after surgery. °Please use a clean towel to pat the incision dry following showers. °Continue to use ice for pain and swelling after surgery. °Do not use any lotions or creams on the incision until instructed by your surgeon. ° °

## 2018-06-23 NOTE — Interval H&P Note (Signed)
History and Physical Interval Note:  06/23/2018 8:21 AM  David Mcintyre  has presented today for surgery, with the diagnosis of Degenerative joint disease right knee valgus  The various methods of treatment have been discussed with the patient and family. After consideration of risks, benefits and other options for treatment, the patient has consented to  Procedure(s): COMPUTER ASSISTED TOTAL KNEE ARTHROPLASTY (Right) as a surgical intervention .  The patient's history has been reviewed, patient examined, no change in status, stable for surgery.  I have reviewed the patient's chart and labs.  Questions were answered to the patient's satisfaction.     Hilton Cork Birda Didonato

## 2018-06-23 NOTE — Anesthesia Postprocedure Evaluation (Signed)
Anesthesia Post Note  Patient: David Mcintyre  Procedure(s) Performed: COMPUTER ASSISTED TOTAL KNEE ARTHROPLASTY (Right )     Patient location during evaluation: PACU Anesthesia Type: Spinal Level of consciousness: oriented and awake and alert Pain management: pain level controlled Vital Signs Assessment: post-procedure vital signs reviewed and stable Respiratory status: spontaneous breathing, respiratory function stable and patient connected to nasal cannula oxygen Cardiovascular status: blood pressure returned to baseline and stable Postop Assessment: no headache, no backache and no apparent nausea or vomiting Anesthetic complications: no    Last Vitals:  Vitals:   06/23/18 1356 06/23/18 1454  BP: 128/83 118/80  Pulse: 60 68  Resp: 16 18  Temp: (!) 36.4 C 36.6 C  SpO2: 100% 100%    Last Pain:  Vitals:   06/23/18 1500  TempSrc:   PainSc: 5                  Tyrhonda Georgiades S

## 2018-06-23 NOTE — Anesthesia Procedure Notes (Signed)
Procedure Name: MAC Date/Time: 06/23/2018 8:25 AM Performed by: Lissa Morales, CRNA Pre-anesthesia Checklist: Patient identified, Emergency Drugs available, Suction available, Patient being monitored and Timeout performed Patient Re-evaluated:Patient Re-evaluated prior to induction Oxygen Delivery Method: Simple face mask Placement Confirmation: positive ETCO2

## 2018-06-23 NOTE — Anesthesia Procedure Notes (Signed)
Anesthesia Regional Block: Adductor canal block   Pre-Anesthetic Checklist: ,, timeout performed, Correct Patient, Correct Site, Correct Laterality, Correct Procedure, Correct Position, site marked, Risks and benefits discussed,  Surgical consent,  Pre-op evaluation,  At surgeon's request and post-op pain management  Laterality: Right  Prep: chloraprep       Needles:  Injection technique: Single-shot  Needle Type: Stimulator Needle - 80     Needle Length: 10cm  Needle Gauge: 21     Additional Needles:   Narrative:  Start time: 06/23/2018 7:56 AM End time: 06/23/2018 8:06 AM Injection made incrementally with aspirations every 5 mL.  Performed by: Personally

## 2018-06-23 NOTE — Progress Notes (Signed)
Assisted Dr. Singer with right, ultrasound guided, adductor canal block. Side rails up, monitors on throughout procedure. See vital signs in flow sheet. Tolerated Procedure well.  

## 2018-06-24 ENCOUNTER — Encounter (HOSPITAL_COMMUNITY): Payer: Self-pay | Admitting: Orthopedic Surgery

## 2018-06-24 DIAGNOSIS — M1711 Unilateral primary osteoarthritis, right knee: Secondary | ICD-10-CM | POA: Diagnosis not present

## 2018-06-24 LAB — BASIC METABOLIC PANEL
Anion gap: 5 (ref 5–15)
BUN: 20 mg/dL (ref 8–23)
CHLORIDE: 109 mmol/L (ref 98–111)
CO2: 23 mmol/L (ref 22–32)
Calcium: 8.6 mg/dL — ABNORMAL LOW (ref 8.9–10.3)
Creatinine, Ser: 1.13 mg/dL (ref 0.61–1.24)
GFR calc Af Amer: >60 mL/min (ref >60)
GFR calc non Af Amer: >60 mL/min (ref >60)
Glucose, Bld: 118 mg/dL — ABNORMAL HIGH (ref 70–99)
Potassium: 4.3 mmol/L (ref 3.5–5.1)
SODIUM: 137 mmol/L (ref 135–145)

## 2018-06-24 LAB — CBC
HCT: 35.6 % — ABNORMAL LOW (ref 39.0–52.0)
Hemoglobin: 11.2 g/dL — ABNORMAL LOW (ref 13.0–17.0)
MCH: 27.8 pg (ref 26.0–34.0)
MCHC: 31.5 g/dL (ref 30.0–36.0)
MCV: 88.3 fL (ref 80.0–100.0)
Platelets: 234 10*3/uL (ref 150–400)
RBC: 4.03 MIL/uL — ABNORMAL LOW (ref 4.22–5.81)
RDW: 14.3 % (ref 11.5–15.5)
WBC: 9.5 10*3/uL (ref 4.0–10.5)
nRBC: 0 % (ref 0.0–0.2)

## 2018-06-24 MED ORDER — SENNA 8.6 MG PO TABS: 2.0000 | ORAL_TABLET | Freq: Every day | ORAL | 0 refills | Status: AC

## 2018-06-24 MED ORDER — DOCUSATE SODIUM 100 MG PO CAPS: 100.0000 mg | ORAL_CAPSULE | Freq: Two times a day (BID) | ORAL | 1 refills | Status: AC

## 2018-06-24 MED ORDER — ASPIRIN 81 MG PO CHEW: 81.0000 mg | CHEWABLE_TABLET | Freq: Two times a day (BID) | ORAL | 1 refills | Status: AC

## 2018-06-24 MED ORDER — HYDROCODONE-ACETAMINOPHEN 5-325 MG PO TABS: 1.0000 | ORAL_TABLET | ORAL | 0 refills | Status: AC | PRN

## 2018-06-24 MED ORDER — ONDANSETRON HCL 4 MG PO TABS: 4.0000 mg | ORAL_TABLET | Freq: Four times a day (QID) | ORAL | 0 refills | Status: AC | PRN

## 2018-06-24 NOTE — Progress Notes (Signed)
    Subjective:  Patient reports pain as mild to moderate.  Denies N/V/CP/SOB. No c/o.  Objective:   VITALS:   Vitals:   06/23/18 2111 06/24/18 0121 06/24/18 0457 06/24/18 0941  BP: 120/88 120/84 130/86 129/79  Pulse: 70 66 70 69  Resp: 16 16 16 18   Temp: 97.7 F (36.5 C) 98.2 F (36.8 C) 98.2 F (36.8 C) 98.1 F (36.7 C)  TempSrc: Oral Oral Oral Oral  SpO2: 96% 99% 99% 99%  Weight:      Height:        NAD ABD soft Sensation intact distally Intact pulses distally Dorsiflexion/Plantar flexion intact Incision: dressing C/D/I Compartment soft   Lab Results  Component Value Date   WBC 9.5 06/24/2018   HGB 11.2 (L) 06/24/2018   HCT 35.6 (L) 06/24/2018   MCV 88.3 06/24/2018   PLT 234 06/24/2018   BMET    Component Value Date/Time   NA 137 06/24/2018 0548   K 4.3 06/24/2018 0548   CL 109 06/24/2018 0548   CO2 23 06/24/2018 0548   GLUCOSE 118 (H) 06/24/2018 0548   BUN 20 06/24/2018 0548   CREATININE 1.13 06/24/2018 0548   CALCIUM 8.6 (L) 06/24/2018 0548   GFRNONAA >60 06/24/2018 0548   GFRAA >60 06/24/2018 0548     Assessment/Plan: 1 Day Post-Op   Principal Problem:   Osteoarthritis of right knee   WBAT with walker DVT ppx: Aspirin, SCDs, TEDS PO pain control PT/OT Dispo: D/C home with OPPT   Hilton Cork Mayjor Ager 06/24/2018, 10:02 AM   Rod Can, MD Cell: [phone] [facility] now [facility]  [place] [address], [place], [place] [address] Phone: [phone] [email or web address] Facebook  [employer]

## 2018-06-24 NOTE — Discharge Summary (Signed)
Physician Discharge Summary  Patient ID: David Mcintyre MRN: [id number] DOB/AGE: 1950-05-08 68 y.o.  Admit date: 06/23/2018 Discharge date: 06/24/2018  Admission Diagnoses:  Osteoarthritis of right knee  Discharge Diagnoses:  Principal Problem:   Osteoarthritis of right knee   Past Medical History:  Diagnosis Date  . Arthritis   . Hemorrhoids   . Hypertension    MILD DIASTOLIC HYPERTENSION  . Prostate cancer [facility])     Surgeries: Procedure(s): COMPUTER ASSISTED TOTAL KNEE ARTHROPLASTY on 06/23/2018   Consultants (if any):   Discharged Condition: Improved  Hospital Course: David Mcintyre is an 68 y.o. male who was admitted 06/23/2018 with a diagnosis of Osteoarthritis of right knee and went to the operating room on 06/23/2018 and underwent the above named procedures.    He was given perioperative antibiotics:  Anti-infectives (From admission, onward)   Start     Dose/Rate Route Frequency Ordered Stop   06/23/18 1430  ceFAZolin (ANCEF) IVPB 2g/100 mL premix     2 g 200 mL/hr over 30 Minutes Intravenous Every 6 hours 06/23/18 1308 06/23/18 2128   06/23/18 0615  ceFAZolin (ANCEF) IVPB 2g/100 mL premix     2 g 200 mL/hr over 30 Minutes Intravenous On call to O.R. 06/23/18 9233 06/23/18 0848    .  He was given sequential compression devices, early ambulation, and ASA for DVT prophylaxis.  He benefited maximally from the hospital stay and there were no complications.    Recent vital signs:  Vitals:   06/24/18 0457 06/24/18 0941  BP: 130/86 129/79  Pulse: 70 69  Resp: 16 18  Temp: 98.2 F (36.8 C) 98.1 F (36.7 C)  SpO2: 99% 99%    Recent laboratory studies:  Lab Results  Component Value Date   HGB 11.2 (L) 06/24/2018   HGB 14.9 06/16/2018   HGB 13.0 06/19/2009   Lab Results  Component Value Date   WBC 9.5 06/24/2018   PLT 234 06/24/2018   No results found for: INR Lab Results  Component Value Date   NA 137 06/24/2018   K 4.3 06/24/2018   CL 109  06/24/2018   CO2 23 06/24/2018   BUN 20 06/24/2018   CREATININE 1.13 06/24/2018   GLUCOSE 118 (H) 06/24/2018    Discharge Medications:   Allergies as of 06/24/2018   No Active Allergies     Medication List    TAKE these medications   aspirin 81 MG chewable tablet Chew 1 tablet (81 mg total) by mouth 2 (two) times daily.   docusate sodium 100 MG capsule Commonly known as:  COLACE Take 1 capsule (100 mg total) by mouth 2 (two) times daily.   HYDROcodone-acetaminophen 5-325 MG tablet Commonly known as:  NORCO/VICODIN Take 1 tablet by mouth every 4 (four) hours as needed for moderate pain (pain score 4-6).   ondansetron 4 MG tablet Commonly known as:  ZOFRAN Take 1 tablet (4 mg total) by mouth every 6 (six) hours as needed for nausea.   OVER THE COUNTER MEDICATION Take 1 Scoop by mouth daily. Vital Reds dietary supplement   psyllium 58.6 % powder Commonly known as:  METAMUCIL Take 1 packet by mouth daily.   senna 8.6 MG Tabs tablet Commonly known as:  SENOKOT Take 2 tablets (17.2 mg total) by mouth at bedtime.       Diagnostic Studies: Dg Knee Right Port  Result Date: 06/23/2018 CLINICAL DATA:  Status post right knee replacement. EXAM: PORTABLE RIGHT KNEE - 1-2 VIEW COMPARISON:  None. FINDINGS: Right total knee replacement is noted. There is gas in the joint space. Arthroplasty is well seated. The knee is located. IMPRESSION: Status post right total knee arthroplasty without radiographic evidence for complication. Electronically Signed   By: San Morelle M.D.   On: 06/23/2018 13:07    Disposition: Discharge disposition: 01-Home or Self Care       Discharge Instructions    Call MD / Call 911   Complete by:  As directed    If you experience chest pain or shortness of breath, CALL 911 and be transported to the hospital emergency room.  If you develope a fever above 101 F, pus (white drainage) or increased drainage or redness at the wound, or calf pain, call  your surgeon's office.   Constipation Prevention   Complete by:  As directed    Drink plenty of fluids.  Prune juice may be helpful.  You may use a stool softener, such as Colace (over the counter) 100 mg twice a day.  Use MiraLax (over the counter) for constipation as needed.   Diet - low sodium heart healthy   Complete by:  As directed    Do not put a pillow under the knee. Place it under the heel.   Complete by:  As directed    Driving restrictions   Complete by:  As directed    No driving for 6 weeks   Increase activity slowly as tolerated   Complete by:  As directed    Lifting restrictions   Complete by:  As directed    No lifting for 6 weeks   TED hose   Complete by:  As directed    Use stockings (TED hose) for 2 weeks on both leg(s).  You may remove them at night for sleeping.      Follow-up Information    Channing Yeager, Aaron Edelman, MD. Schedule an appointment as soon as possible for a visit in 2 weeks.   Specialty:  Orthopedic Surgery Why:  For wound re-check Contact information: [address] [place] [address] [phone]            Signed: Hilton Cork Ollivander See 06/24/2018, 10:08 AM

## 2018-06-24 NOTE — Care Management Note (Signed)
Case Management Note  Patient Details  Name: David Mcintyre MRN: [id number] Date of Birth: 10-Dec-1950  Subjective/Objective:                  Discharge planning  Action/Plan: [facility]  DME-Rolling walker and 3 in 1 through [facility]. Expected Discharge Date:                  Expected Discharge Plan:  Home/Self Care  In-House Referral:     Discharge planning Services  CM Consult  Post Acute Care Choice:    Choice offered to:     DME Arranged:    DME Agency:     HH Arranged:    [place] Agency:     Status of Service:  Completed, signed off  If discussed at [employer] of Stay Meetings, dates discussed:    Additional Comments:  Leeroy Cha, RN 06/24/2018, 9:53 AM

## 2018-06-24 NOTE — Care Management Obs Status (Signed)
[facility] [place] NOTIFICATION   Patient Details  Name: David Mcintyre MRN: [id number] Date of Birth: 04-10-1951   Medicare Observation Status Notification Given:  Yes    Leeroy Cha, RN 06/24/2018, 10:29 AM

## 2018-06-24 NOTE — Progress Notes (Signed)
Physical Therapy Treatment Patient Details Name: David Mcintyre MRN: [id number] DOB: 1950-12-27 Today's Date: 06/24/2018    History of Present Illness Pt s/p R TKR    PT Comments    Pt progressing well with mobility and eager for dc home.  Pt and son reviewed stairs and home therex program with written instruction provided.   Follow Up Recommendations  Follow surgeon's recommendation for DC plan and follow-up therapies     Equipment Recommendations  None recommended by PT    Recommendations for Other Services       Precautions / Restrictions Precautions Precautions: Knee;Fall Restrictions Weight Bearing Restrictions: No Other Position/Activity Restrictions: WBAT    Mobility  Bed Mobility Overal bed mobility: Needs Assistance Bed Mobility: Supine to Sit     Supine to sit: Supervision     General bed mobility comments: cues for sequence and use of L LE to self assist  Transfers Overall transfer level: Needs assistance Equipment used: Rolling walker (2 wheeled) Transfers: Sit to/from Stand Sit to Stand: Supervision         General transfer comment: cues for LE management and use of UEs to self assist  Ambulation/Gait Ambulation/Gait assistance: Min guard;Supervision Gait Distance (Feet): 180 Feet Assistive device: Rolling walker (2 wheeled) Gait Pattern/deviations: Step-to pattern;Step-through pattern;Decreased step length - right;Decreased step length - left;Shuffle;Trunk flexed Gait velocity: decr   General Gait Details: cues for posture, position from RW and initial sequence   Stairs Stairs: Yes Stairs assistance: Min assist Stair Management: No rails;One rail Left;Step to pattern;Forwards;With cane Number of Stairs: 4 General stair comments: 2 steps bkwd with RW and 2 steps fwd with rail and cane.  Cues for sequence and for foot/cane placement   Wheelchair Mobility    Modified Rankin (Stroke Patients Only)       Balance Overall balance  assessment: Mild deficits observed, not formally tested                                          Cognition Arousal/Alertness: Awake/alert Behavior During Therapy: WFL for tasks assessed/performed Overall Cognitive Status: Within Functional Limits for tasks assessed                                        Exercises Total Joint Exercises Ankle Circles/Pumps: AROM;Both;15 reps;Supine Quad Sets: AROM;Both;10 reps;Supine Heel Slides: AAROM;Right;15 reps;Supine Straight Leg Raises: AAROM;AROM;Right;15 reps;Supine Long Arc Quad: AAROM;AROM;Right;10 reps;Seated Knee Flexion: AROM;AAROM;Right;15 reps;Seated Goniometric ROM: R knee -5 - 90    General Comments        Pertinent Vitals/Pain Pain Assessment: 0-10 Pain Score: 5  Pain Location: R knee Pain Descriptors / Indicators: Aching;Sore Pain Intervention(s): Limited activity within patient's tolerance;Monitored during session;Premedicated before session;Ice applied    Home Living                      Prior Function            PT Goals (current goals can now be found in the care plan section) Acute Rehab PT Goals Patient Stated Goal: Regain IND PT Goal Formulation: With patient Time For Goal Achievement: 06/30/18 Potential to Achieve Goals: Good Progress towards PT goals: Progressing toward goals    Frequency    7X/week      PT Plan Current  plan remains appropriate    Co-evaluation              AM-PAC PT "6 Clicks" Mobility   Outcome Measure  Help needed turning from your back to your side while in a flat bed without using bedrails?: None Help needed moving from lying on your back to sitting on the side of a flat bed without using bedrails?: None Help needed moving to and from a bed to a chair (including a wheelchair)?: A Little Help needed standing up from a chair using your arms (e.g., wheelchair or bedside chair)?: A Little Help needed to walk in hospital room?:  A Little Help needed climbing 3-5 steps with a railing? : A Little 6 Click Score: 20    End of Session Equipment Utilized During Treatment: Gait belt Activity Tolerance: Patient tolerated treatment well Patient left: in chair;with call bell/phone within reach;with chair alarm set Nurse Communication: Mobility status PT Visit Diagnosis: Difficulty in walking, not elsewhere classified (R26.2)     Time: 3382-5053 PT Time Calculation (min) (ACUTE ONLY): 38 min  Charges:  $Gait Training: 8-22 mins $Therapeutic Exercise: 8-22 mins $Therapeutic Activity: 8-22 mins                     [place] Pager [phone] Office [phone]    David Mcintyre 06/24/2018, 9:44 AM

## 2018-09-22 ENCOUNTER — Encounter (HOSPITAL_BASED_OUTPATIENT_CLINIC_OR_DEPARTMENT_OTHER): Payer: Self-pay

## 2018-09-22 ENCOUNTER — Ambulatory Visit (HOSPITAL_BASED_OUTPATIENT_CLINIC_OR_DEPARTMENT_OTHER): Admit: 2018-09-22 | Payer: Medicare Other | Admitting: Surgery

## 2018-09-22 SURGERY — HEMORRHOIDECTOMY
Anesthesia: General

## 2019-05-31 ENCOUNTER — Ambulatory Visit: Payer: Medicare Other

## 2019-06-09 ENCOUNTER — Ambulatory Visit: Payer: Medicare PPO | Attending: Internal Medicine

## 2019-06-09 DIAGNOSIS — Z23 Encounter for immunization: Secondary | ICD-10-CM | POA: Insufficient documentation

## 2019-06-09 NOTE — Progress Notes (Signed)
   Covid-19 Vaccination Clinic  Name:  David Mcintyre    MRN: [id number] DOB: Jul 15, 1950  06/09/2019  [name] was observed post Covid-19 immunization for 15 minutes without incidence. He was provided with Vaccine Information Sheet and instruction to access the V-Safe system.   [name] was instructed to call 911 with any severe reactions post vaccine: Marland Kitchen Difficulty breathing  . Swelling of your face and throat  . A fast heartbeat  . A bad rash all over your body  . Dizziness and weakness    Immunizations Administered    Name Date Dose VIS Date Route   Pfizer COVID-19 Vaccine 06/09/2019  8:24 AM 0.3 mL 04/15/2019 Intramuscular   Manufacturer: [place]   Lot: [id number]   [place]: [id number]

## 2019-07-04 ENCOUNTER — Ambulatory Visit: Payer: Medicare PPO | Attending: Internal Medicine

## 2019-07-04 DIAGNOSIS — Z23 Encounter for immunization: Secondary | ICD-10-CM | POA: Insufficient documentation

## 2019-07-04 NOTE — Progress Notes (Signed)
   Covid-19 Vaccination Clinic  Name:  FRANKY BUCCO    MRN: [id number] DOB: [birth year]  07/04/2019  Mr. Spillman was observed post Covid-19 immunization for 15 minutes without incidence. He was provided with Vaccine Information Sheet and instruction to access the V-Safe system.   Mr. Josephsen was instructed to call 911 with any severe reactions post vaccine: Marland Kitchen Difficulty breathing  . Swelling of your face and throat  . A fast heartbeat  . A bad rash all over your body  . Dizziness and weakness    Immunizations Administered    Name Date Dose VIS Date Route   Pfizer COVID-19 Vaccine 07/04/2019 12:41 PM 0.3 mL 04/15/2019 Intramuscular   Manufacturer: [place]   Lot: [id number]   [place]: [id number]

## 2019-11-23 DIAGNOSIS — M25571 Pain in right ankle and joints of right foot: Secondary | ICD-10-CM | POA: Diagnosis not present

## 2019-11-24 DIAGNOSIS — M25571 Pain in right ankle and joints of right foot: Secondary | ICD-10-CM | POA: Diagnosis not present

## 2019-11-25 DIAGNOSIS — S86011A Strain of right Achilles tendon, initial encounter: Secondary | ICD-10-CM | POA: Diagnosis not present

## 2019-12-09 DIAGNOSIS — S86011D Strain of right Achilles tendon, subsequent encounter: Secondary | ICD-10-CM | POA: Diagnosis not present

## 2019-12-19 DIAGNOSIS — M25671 Stiffness of right ankle, not elsewhere classified: Secondary | ICD-10-CM | POA: Diagnosis not present

## 2019-12-19 DIAGNOSIS — R269 Unspecified abnormalities of gait and mobility: Secondary | ICD-10-CM | POA: Diagnosis not present

## 2019-12-19 DIAGNOSIS — S86011D Strain of right Achilles tendon, subsequent encounter: Secondary | ICD-10-CM | POA: Diagnosis not present

## 2019-12-23 DIAGNOSIS — M25671 Stiffness of right ankle, not elsewhere classified: Secondary | ICD-10-CM | POA: Diagnosis not present

## 2019-12-23 DIAGNOSIS — R269 Unspecified abnormalities of gait and mobility: Secondary | ICD-10-CM | POA: Diagnosis not present

## 2019-12-23 DIAGNOSIS — S86011D Strain of right Achilles tendon, subsequent encounter: Secondary | ICD-10-CM | POA: Diagnosis not present

## 2019-12-27 DIAGNOSIS — R269 Unspecified abnormalities of gait and mobility: Secondary | ICD-10-CM | POA: Diagnosis not present

## 2019-12-27 DIAGNOSIS — M25671 Stiffness of right ankle, not elsewhere classified: Secondary | ICD-10-CM | POA: Diagnosis not present

## 2019-12-27 DIAGNOSIS — S86011D Strain of right Achilles tendon, subsequent encounter: Secondary | ICD-10-CM | POA: Diagnosis not present

## 2019-12-29 DIAGNOSIS — R269 Unspecified abnormalities of gait and mobility: Secondary | ICD-10-CM | POA: Diagnosis not present

## 2019-12-29 DIAGNOSIS — S86011D Strain of right Achilles tendon, subsequent encounter: Secondary | ICD-10-CM | POA: Diagnosis not present

## 2019-12-29 DIAGNOSIS — M25671 Stiffness of right ankle, not elsewhere classified: Secondary | ICD-10-CM | POA: Diagnosis not present

## 2020-01-03 ENCOUNTER — Ambulatory Visit: Payer: Medicare PPO | Attending: Internal Medicine

## 2020-01-03 DIAGNOSIS — R269 Unspecified abnormalities of gait and mobility: Secondary | ICD-10-CM | POA: Diagnosis not present

## 2020-01-03 DIAGNOSIS — S86011D Strain of right Achilles tendon, subsequent encounter: Secondary | ICD-10-CM | POA: Diagnosis not present

## 2020-01-03 DIAGNOSIS — M25671 Stiffness of right ankle, not elsewhere classified: Secondary | ICD-10-CM | POA: Diagnosis not present

## 2020-01-03 DIAGNOSIS — Z23 Encounter for immunization: Secondary | ICD-10-CM

## 2020-01-03 NOTE — Progress Notes (Signed)
   Covid-19 Vaccination Clinic  Name:  David Mcintyre    MRN: [id number] DOB: 1950-06-25  01/03/2020  David Mcintyre was observed post Covid-19 immunization for 15 minutes without incident. He was provided with Vaccine Information Sheet and instruction to access the V-Safe system.   David Mcintyre was instructed to call 911 with any severe reactions post vaccine: Marland Kitchen Difficulty breathing  . Swelling of face and throat  . A fast heartbeat  . A bad rash all over body  . Dizziness and weakness

## 2020-01-05 DIAGNOSIS — M25671 Stiffness of right ankle, not elsewhere classified: Secondary | ICD-10-CM | POA: Diagnosis not present

## 2020-01-05 DIAGNOSIS — S86011D Strain of right Achilles tendon, subsequent encounter: Secondary | ICD-10-CM | POA: Diagnosis not present

## 2020-01-05 DIAGNOSIS — R269 Unspecified abnormalities of gait and mobility: Secondary | ICD-10-CM | POA: Diagnosis not present

## 2020-01-11 DIAGNOSIS — M25671 Stiffness of right ankle, not elsewhere classified: Secondary | ICD-10-CM | POA: Diagnosis not present

## 2020-01-11 DIAGNOSIS — S86011D Strain of right Achilles tendon, subsequent encounter: Secondary | ICD-10-CM | POA: Diagnosis not present

## 2020-01-11 DIAGNOSIS — R269 Unspecified abnormalities of gait and mobility: Secondary | ICD-10-CM | POA: Diagnosis not present

## 2020-01-13 DIAGNOSIS — R269 Unspecified abnormalities of gait and mobility: Secondary | ICD-10-CM | POA: Diagnosis not present

## 2020-01-13 DIAGNOSIS — M25671 Stiffness of right ankle, not elsewhere classified: Secondary | ICD-10-CM | POA: Diagnosis not present

## 2020-01-13 DIAGNOSIS — S86011D Strain of right Achilles tendon, subsequent encounter: Secondary | ICD-10-CM | POA: Diagnosis not present

## 2020-01-17 DIAGNOSIS — S86011D Strain of right Achilles tendon, subsequent encounter: Secondary | ICD-10-CM | POA: Diagnosis not present

## 2020-01-17 DIAGNOSIS — M25671 Stiffness of right ankle, not elsewhere classified: Secondary | ICD-10-CM | POA: Diagnosis not present

## 2020-01-17 DIAGNOSIS — R269 Unspecified abnormalities of gait and mobility: Secondary | ICD-10-CM | POA: Diagnosis not present

## 2020-01-19 DIAGNOSIS — S86011D Strain of right Achilles tendon, subsequent encounter: Secondary | ICD-10-CM | POA: Diagnosis not present

## 2020-01-19 DIAGNOSIS — R269 Unspecified abnormalities of gait and mobility: Secondary | ICD-10-CM | POA: Diagnosis not present

## 2020-01-19 DIAGNOSIS — M25671 Stiffness of right ankle, not elsewhere classified: Secondary | ICD-10-CM | POA: Diagnosis not present

## 2020-01-20 DIAGNOSIS — S86011D Strain of right Achilles tendon, subsequent encounter: Secondary | ICD-10-CM | POA: Diagnosis not present

## 2020-01-25 DIAGNOSIS — R269 Unspecified abnormalities of gait and mobility: Secondary | ICD-10-CM | POA: Diagnosis not present

## 2020-01-25 DIAGNOSIS — S86011D Strain of right Achilles tendon, subsequent encounter: Secondary | ICD-10-CM | POA: Diagnosis not present

## 2020-01-25 DIAGNOSIS — M25671 Stiffness of right ankle, not elsewhere classified: Secondary | ICD-10-CM | POA: Diagnosis not present

## 2020-01-31 DIAGNOSIS — R269 Unspecified abnormalities of gait and mobility: Secondary | ICD-10-CM | POA: Diagnosis not present

## 2020-01-31 DIAGNOSIS — M25671 Stiffness of right ankle, not elsewhere classified: Secondary | ICD-10-CM | POA: Diagnosis not present

## 2020-01-31 DIAGNOSIS — S86011D Strain of right Achilles tendon, subsequent encounter: Secondary | ICD-10-CM | POA: Diagnosis not present

## 2020-02-11 IMAGING — DX DG KNEE 1-2V PORT*R*
2 series · 2 of 2 positions shown · non-contrast
Comparison: None.

CLINICAL DATA: Status post right knee replacement.

EXAM:
PORTABLE RIGHT KNEE - 1-2 VIEW

[knee ap]
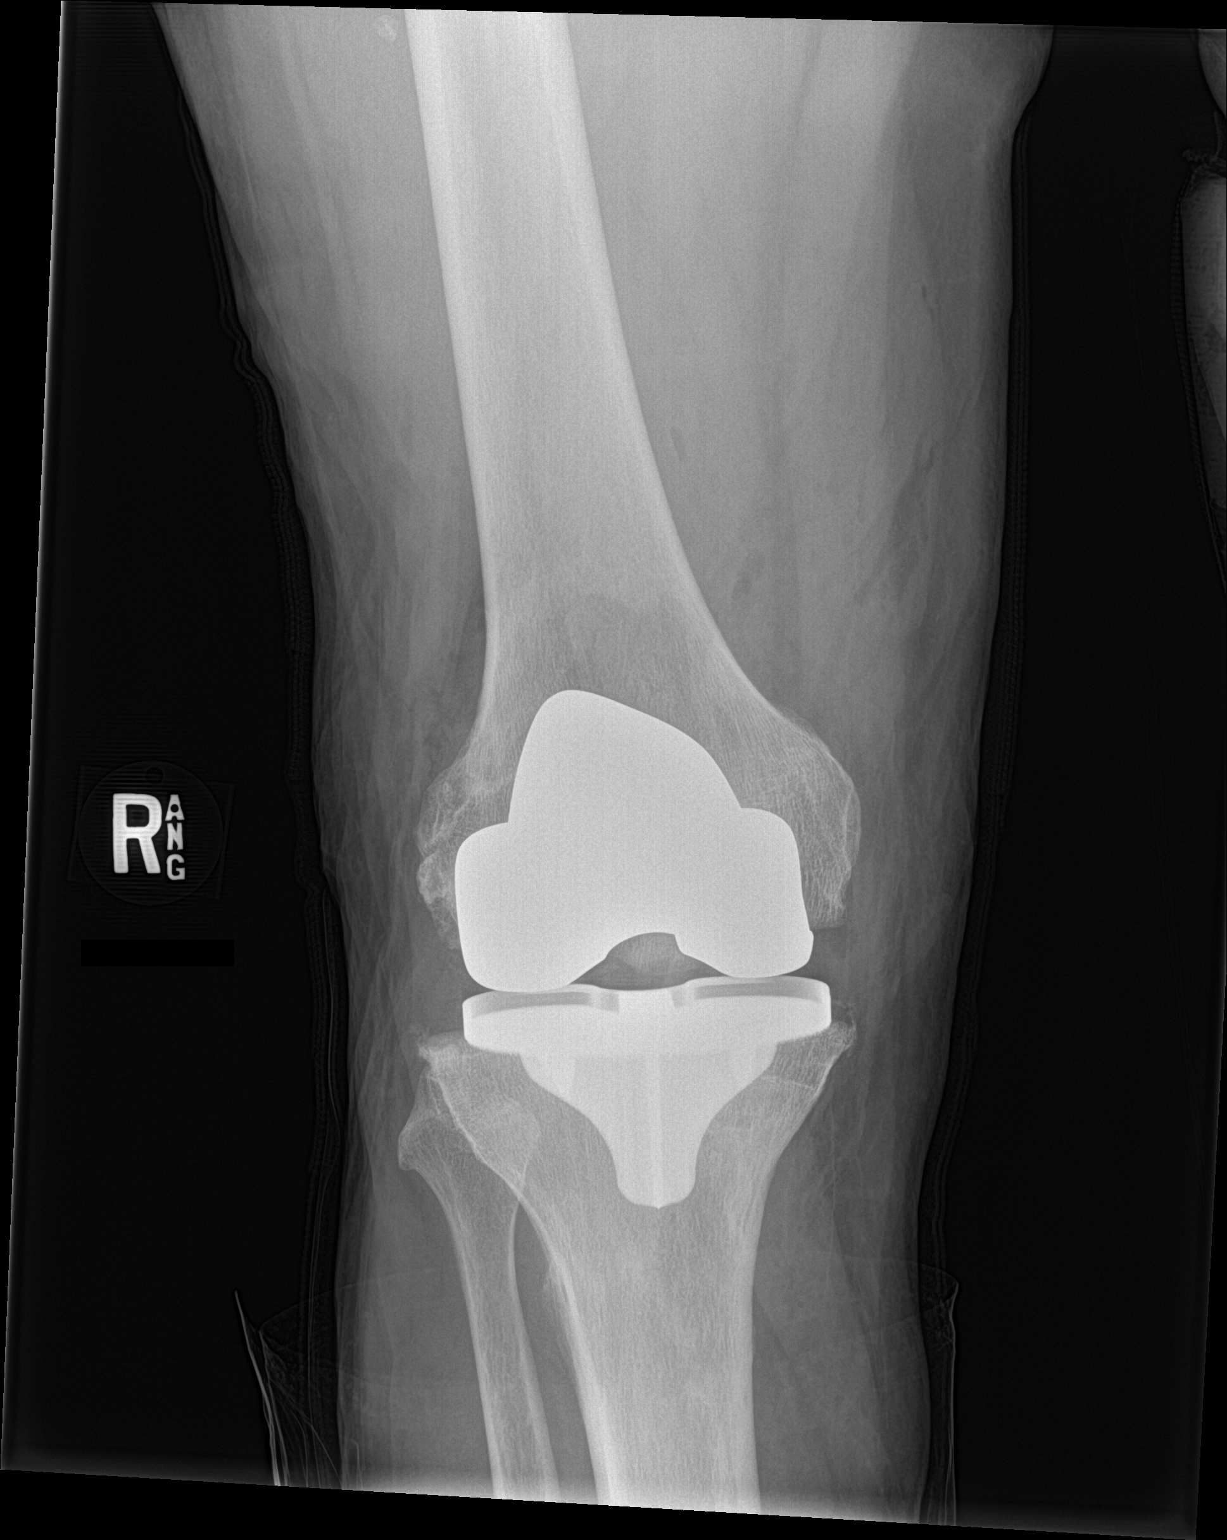

[knee lat]
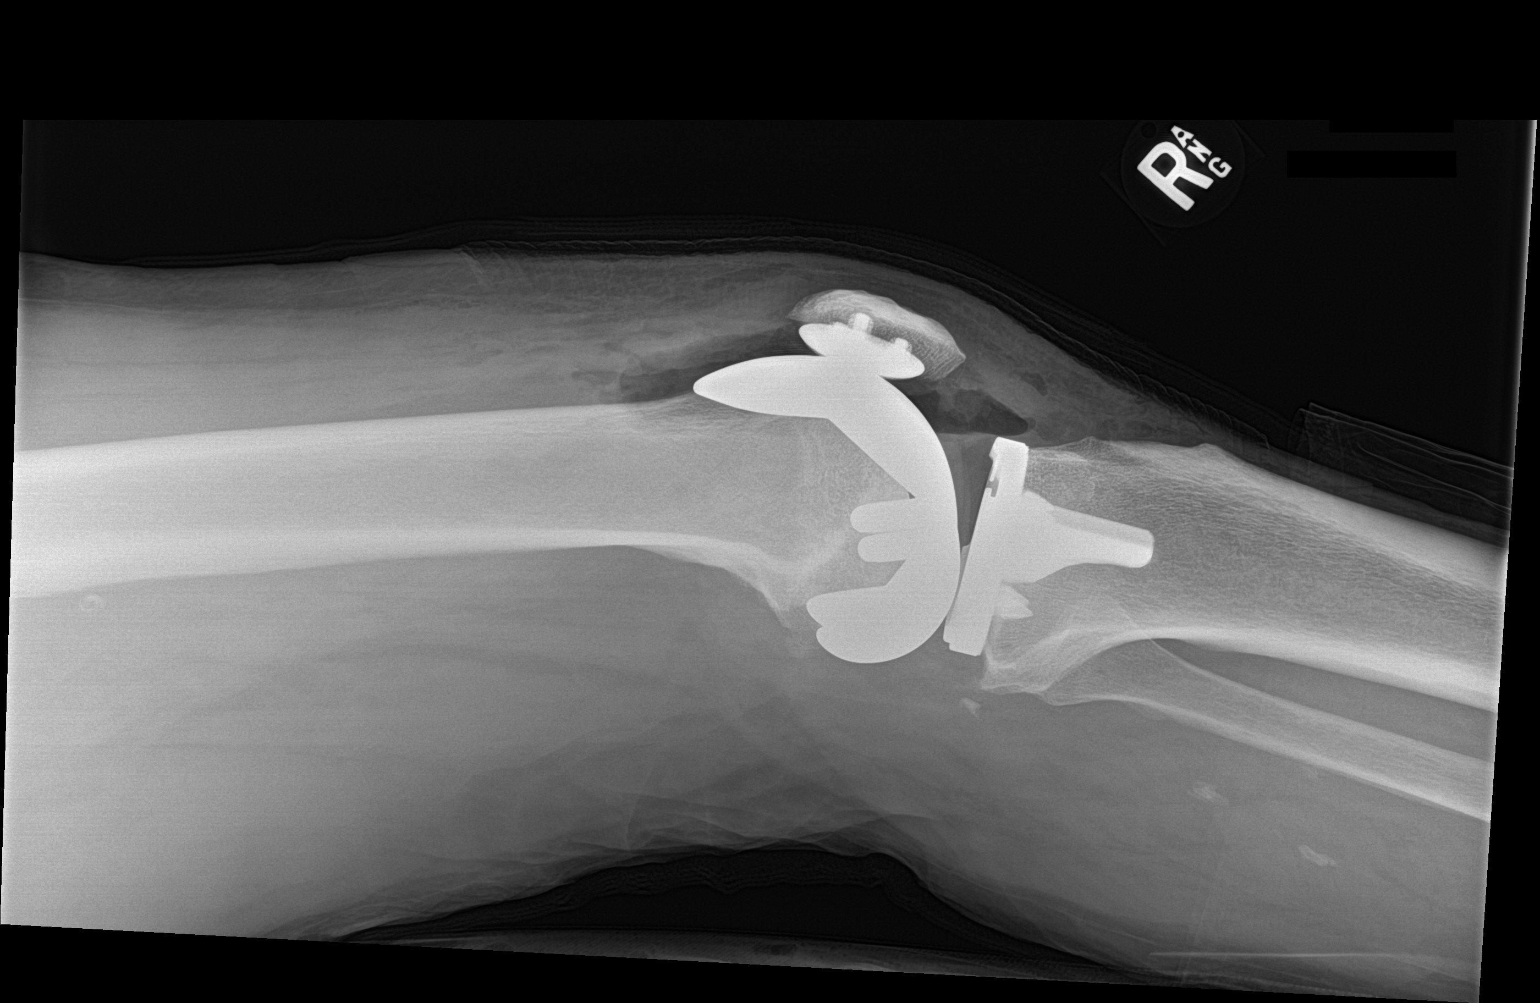

[2 of 2 positions shown; findings below may reference images not displayed]

FINDINGS: Right total knee replacement is noted. There is gas in the joint
space. Arthroplasty is well seated. The knee is located.
IMPRESSION: Status post right total knee arthroplasty without radiographic
evidence for complication.

## 2020-02-28 ENCOUNTER — Encounter (HOSPITAL_COMMUNITY): Payer: Self-pay | Admitting: Emergency Medicine

## 2020-02-28 ENCOUNTER — Emergency Department (HOSPITAL_COMMUNITY): Payer: Medicare PPO

## 2020-02-28 ENCOUNTER — Other Ambulatory Visit: Payer: Self-pay

## 2020-02-28 ENCOUNTER — Emergency Department (HOSPITAL_COMMUNITY)
Admission: EM | Admit: 2020-02-28 | Discharge: 2020-02-28 | Disposition: A | Discharge status: Home / Self Care | Payer: Medicare PPO | Attending: Emergency Medicine | Admitting: Emergency Medicine

## 2020-02-28 DIAGNOSIS — R202 Paresthesia of skin: Secondary | ICD-10-CM | POA: Diagnosis not present

## 2020-02-28 DIAGNOSIS — I119 Hypertensive heart disease without heart failure: Secondary | ICD-10-CM | POA: Diagnosis not present

## 2020-02-28 DIAGNOSIS — R0789 Other chest pain: Secondary | ICD-10-CM

## 2020-02-28 DIAGNOSIS — Z96651 Presence of right artificial knee joint: Secondary | ICD-10-CM | POA: Diagnosis not present

## 2020-02-28 DIAGNOSIS — R079 Chest pain, unspecified: Secondary | ICD-10-CM | POA: Diagnosis not present

## 2020-02-28 DIAGNOSIS — Z7982 Long term (current) use of aspirin: Secondary | ICD-10-CM | POA: Diagnosis not present

## 2020-02-28 DIAGNOSIS — Z8546 Personal history of malignant neoplasm of prostate: Secondary | ICD-10-CM | POA: Insufficient documentation

## 2020-02-28 LAB — CBC
HCT: 45.8 % (ref 39.0–52.0)
Hemoglobin: 14.7 g/dL (ref 13.0–17.0)
MCH: 27.1 pg (ref 26.0–34.0)
MCHC: 32.1 g/dL (ref 30.0–36.0)
MCV: 84.5 fL (ref 80.0–100.0)
Platelets: 310 10*3/uL (ref 150–400)
RBC: 5.42 MIL/uL (ref 4.22–5.81)
RDW: 14.1 % (ref 11.5–15.5)
WBC: 4 10*3/uL (ref 4.0–10.5)
nRBC: 0 % (ref 0.0–0.2)

## 2020-02-28 LAB — BASIC METABOLIC PANEL
Anion gap: 11 (ref 5–15)
BUN: 15 mg/dL (ref 8–23)
CO2: 20 mmol/L — ABNORMAL LOW (ref 22–32)
Calcium: 9.4 mg/dL (ref 8.9–10.3)
Chloride: 105 mmol/L (ref 98–111)
Creatinine, Ser: 1.15 mg/dL (ref 0.61–1.24)
GFR, Estimated: >60 mL/min (ref >60)
Glucose, Bld: 116 mg/dL — ABNORMAL HIGH (ref 70–99)
Potassium: 4 mmol/L (ref 3.5–5.1)
Sodium: 136 mmol/L (ref 135–145)

## 2020-02-28 LAB — TROPONIN I (HIGH SENSITIVITY)
Troponin I (High Sensitivity): <2 ng/L (ref <18)
Troponin I (High Sensitivity): <2 ng/L (ref <18)

## 2020-02-28 NOTE — ED Triage Notes (Signed)
Pt c/o cp radiating to his arms, with a tingling sensation in and out for the past 4 days. Denies sob, nausea or vomiting.

## 2020-02-28 NOTE — Discharge Instructions (Addendum)
You are seen today for chest pain.  Your work-up today was reassuring.  I want you to follow-up with cardiology, their information is provided.  If you start having any new or worsening chest pain that is worse upon exertion, shortness of breath please come back to the emergency department.  Please use the attached instructions.

## 2020-02-28 NOTE — ED Provider Notes (Signed)
[facility] EMERGENCY DEPARTMENT Provider Note   CSN: [id number] Arrival date & time: 02/28/20  1215     History Chief Complaint  Patient presents with  . Chest Pain    David Mcintyre is a 69 y.o. male with pertinent past medical history of hypertension that presents the emergency department today for chest pain.  Patient states that for the past 4 days he  has had episodes of chest pain that are in the center of his chest, describes it as a uncomfortable feeling, cramping sensation that radiates down into his left arm.  States that also has some numbness and tingling when this occurs.  Patient states that he has had this happen for the past 4 days, last about 5 minutes each time.  Has never had this occur before.  Has not been progressing or worsening.  Chest pain does not radiate to back.  He states that this only occurs while reading the paper in a seated position, in the morning.  Does not occur anytime else throughout the day and only last for 5 minutes.  Patient denies any nausea, vomiting or shortness of breath when this occurs.  States that he is pretty healthy, doesn't take any medications for his hypertension, states that his doctor states that he doesn't need to.  No diabetes.  States that he is generally healthy, works out daily.  States that he was in normal health before this.  Denies any chest pain currently.  Patient states that he plays a lot of golf.  Has not tried anything for this.  HPI     Past Medical History:  Diagnosis Date  . Arthritis   . Hemorrhoids   . Hypertension    MILD DIASTOLIC HYPERTENSION  . Prostate cancer [facility])     Patient Active Problem List   Diagnosis Date Noted  . Osteoarthritis of right knee 06/23/2018  . Hemorrhage of rectum and anus 06/14/2013  . Hypercholesterolemia 12/18/2010  . Benign hypertensive heart disease without heart failure 12/18/2010  . Prostate cancer ([place]) 12/18/2010    Past Surgical History:    Procedure Laterality Date  . KNEE ARTHROPLASTY Right 06/23/2018   Procedure: COMPUTER ASSISTED TOTAL KNEE ARTHROPLASTY;  Surgeon: Rod Can, MD;  Location: [facility] ORS;  Service: Orthopedics;  Laterality: Right;  . KNEE SURGERY     meniscus   . PROSTATECTOMY     RADICAL PROSTATECTOMY       Family History  Problem Relation Age of Onset  . Cancer Mother     Social History   Tobacco Use  . Smoking status: Never Smoker  . Smokeless tobacco: Never Used  Vaping Use  . Vaping Use: Never used  Substance Use Topics  . Alcohol use: Yes    Alcohol/week: 2.0 standard drinks    Types: 2 Cans of beer per week  . Drug use: No    Home Medications Prior to Admission medications   Medication Sig Start Date End Date Taking? Authorizing Provider  aspirin 81 MG chewable tablet Chew 1 tablet (81 mg total) by mouth 2 (two) times daily. 06/24/18   Swinteck, Aaron Edelman, MD  docusate sodium (COLACE) 100 MG capsule Take 1 capsule (100 mg total) by mouth 2 (two) times daily. 06/24/18   Swinteck, Aaron Edelman, MD  HYDROcodone-acetaminophen (NORCO/VICODIN) 5-325 MG tablet Take 1 tablet by mouth every 4 (four) hours as needed for moderate pain (pain score 4-6). 06/24/18   Swinteck, Aaron Edelman, MD  ondansetron (ZOFRAN) 4 MG tablet Take 1 tablet (  4 mg total) by mouth every 6 (six) hours as needed for nausea. 06/24/18   Swinteck, Aaron Edelman, MD  OVER THE COUNTER MEDICATION Take 1 Scoop by mouth daily. Vital Reds dietary supplement    [provider]  psyllium (METAMUCIL) 58.6 % powder Take 1 packet by mouth daily.    [provider]  senna (SENOKOT) 8.6 MG TABS tablet Take 2 tablets (17.2 mg total) by mouth at bedtime. 06/24/18   Rod Can, MD    Allergies    Patient has no known allergies.  Review of Systems   Review of Systems  Constitutional: Negative for chills, diaphoresis, fatigue and fever.  HENT: Negative for congestion, sore throat and trouble swallowing.   Eyes: Negative for pain and visual  disturbance.  Respiratory: Negative for cough, shortness of breath and wheezing.   Cardiovascular: Positive for chest pain. Negative for palpitations and leg swelling.  Gastrointestinal: Negative for abdominal distention, abdominal pain, diarrhea, nausea and vomiting.  Genitourinary: Negative for difficulty urinating.  Musculoskeletal: Negative for back pain, neck pain and neck stiffness.  Skin: Negative for pallor.  Neurological: Positive for numbness. Negative for dizziness, speech difficulty, weakness and headaches.  Psychiatric/Behavioral: Negative for confusion.    Physical Exam Updated Vital Signs BP (!) 134/97   Pulse 74   Temp 98.7 F (37.1 C) (Oral)   Resp 18   Ht 5\' 8"  (1.727 m)   Wt 97.5 kg   SpO2 100%   BMI 32.69 kg/m   Physical Exam Constitutional:      General: He is not in acute distress.    Appearance: Normal appearance. He is not ill-appearing, toxic-appearing or diaphoretic.  HENT:     Mouth/Throat:     Mouth: Mucous membranes are moist.     Pharynx: Oropharynx is clear.  Eyes:     General: No scleral icterus.    Extraocular Movements: Extraocular movements intact.     Pupils: Pupils are equal, round, and reactive to light.  Cardiovascular:     Rate and Rhythm: Normal rate and regular rhythm.     Pulses: Normal pulses.     Heart sounds: Normal heart sounds.  Pulmonary:     Effort: Pulmonary effort is normal. No respiratory distress.     Breath sounds: Normal breath sounds. No stridor. No wheezing, rhonchi or rales.  Chest:     Chest wall: No tenderness.  Abdominal:     General: Abdomen is flat. There is no distension.     Palpations: Abdomen is soft.     Tenderness: There is no abdominal tenderness. There is no guarding or rebound.  Musculoskeletal:        General: No swelling or tenderness. Normal range of motion.     Cervical back: Normal range of motion and neck supple. No rigidity.     Right lower leg: No edema.     Left lower leg: No edema.    Skin:    General: Skin is warm and dry.     Capillary Refill: Capillary refill takes less than 2 seconds.     Coloration: Skin is not pale.  Neurological:     General: No focal deficit present.     Mental Status: He is alert and oriented to person, place, and time.     Cranial Nerves: No cranial nerve deficit.     Sensory: No sensory deficit.     Motor: No weakness.     Comments: No sensation changes, normal strength of bilateral upper extremity.  Psychiatric:        Mood and Affect: Mood normal.        Behavior: Behavior normal.     ED Results / Procedures / Treatments   Labs (all labs ordered are listed, but only abnormal results are displayed) Labs Reviewed  BASIC METABOLIC PANEL - Abnormal; Notable for the following components:      Result Value   CO2 20 (*)    Glucose, Bld 116 (*)    All other components within normal limits  CBC  TROPONIN I (HIGH SENSITIVITY)  TROPONIN I (HIGH SENSITIVITY)    EKG EKG Interpretation  Date/Time:  Tuesday February 28 2020 12:19:06 EDT Ventricular Rate:  89 PR Interval:  186 QRS Duration: 94 QT Interval:  374 QTC Calculation: 455 R Axis:   -41 Text Interpretation: Normal sinus rhythm Left axis deviation Incomplete right bundle branch block Cannot rule out Anterior infarct , age undetermined Abnormal ECG Confirmed by Madalyn Rob [phone]) on 02/28/2020 1:10:30 PM   Radiology DG Chest 2 View  Result Date: 02/28/2020 CLINICAL DATA:  Chest pain. EXAM: CHEST - 2 VIEW COMPARISON:  Chest radiographs 02/16/2009 FINDINGS: The heart size and mediastinal contours are within normal limits. Both lungs are clear. No pleural effusions or pneumothorax. The visualized skeletal structures are unremarkable. IMPRESSION: No active cardiopulmonary disease. Electronically Signed   By: Margaretha Sheffield MD   On: 02/28/2020 12:58    Procedures Procedures (including critical care time)  Medications Ordered in ED Medications - No data to  display  ED Course  I have reviewed the triage vital signs and the nursing notes.  Pertinent labs & imaging results that were available during my care of the patient were reviewed by me and considered in my medical decision making (see chart for details).    MDM Rules/Calculators/A&P                         David Mcintyre is a 69 y.o. male with pertinent past medical history of hypertension that presents the emergency department today for chest pain.  Chest pain does radiate down to her arm with numbness and tingling, last about 5 minutes.  Has not been worsening, is not worsening upon exertion.  Will obtain ACS work-up at this time, however could be musculoskeletal from golf use.  Work-up today reassuring, BMP and CBC unremarkable.  First troponin less than 2, second troponin also less than 2. HEAR score 4.  Chest x-ray interpreted me without any acute cardiopulmonary disease.  EKG interpreted by me without any signs of ischemia or arrhythmia, confirmed by Dr. Roslynn Amble.  Patient is to be discharged with recommendation to follow up with PCP in regards to today's hospital visit.  Also refer to cardiology.   Pt has been advised to return to the ED if CP becomes exertional, associated with diaphoresis or nausea, radiates to left jaw/arm, worsens or becomes concerning in any way. Pt appears reliable for follow up and is agreeable to discharge.   Doubt need for further emergent work up at this time. I explained the diagnosis and have given explicit precautions to return to the ER including for any other new or worsening symptoms. The patient understands and accepts the medical plan as it's been dictated and I have answered their questions. Discharge instructions concerning home care and prescriptions have been given. The patient is STABLE and is discharged to home in good condition.  I discussed this case with my attending physician  who cosigned this note including patient's presenting symptoms,  physical exam, and planned diagnostics and interventions. Attending physician stated agreement with plan or made changes to plan which were implemented.   Attending physician assessed patient at bedside.  Final Clinical Impression(s) / ED Diagnoses Final diagnoses:  Other chest pain    Rx / DC Orders ED Discharge Orders    None       Alfredia Client, PA-C 02/28/20 1531    Lucrezia Starch, MD 02/29/20 2354

## 2020-06-15 DIAGNOSIS — E785 Hyperlipidemia, unspecified: Secondary | ICD-10-CM | POA: Diagnosis not present

## 2020-06-22 DIAGNOSIS — R82998 Other abnormal findings in urine: Secondary | ICD-10-CM | POA: Diagnosis not present

## 2020-06-22 DIAGNOSIS — Z8546 Personal history of malignant neoplasm of prostate: Secondary | ICD-10-CM | POA: Diagnosis not present

## 2020-06-22 DIAGNOSIS — E785 Hyperlipidemia, unspecified: Secondary | ICD-10-CM | POA: Diagnosis not present

## 2020-06-22 DIAGNOSIS — Z1212 Encounter for screening for malignant neoplasm of rectum: Secondary | ICD-10-CM | POA: Diagnosis not present

## 2020-06-22 DIAGNOSIS — I10 Essential (primary) hypertension: Secondary | ICD-10-CM | POA: Diagnosis not present

## 2020-06-22 DIAGNOSIS — Z125 Encounter for screening for malignant neoplasm of prostate: Secondary | ICD-10-CM | POA: Diagnosis not present

## 2020-06-22 DIAGNOSIS — Z0000 Encounter for general adult medical examination without abnormal findings: Secondary | ICD-10-CM | POA: Diagnosis not present

## 2020-09-24 ENCOUNTER — Ambulatory Visit: Payer: Medicare PPO | Attending: Internal Medicine

## 2020-09-24 ENCOUNTER — Other Ambulatory Visit (HOSPITAL_BASED_OUTPATIENT_CLINIC_OR_DEPARTMENT_OTHER): Payer: Self-pay

## 2020-09-24 ENCOUNTER — Other Ambulatory Visit: Payer: Self-pay

## 2020-09-24 DIAGNOSIS — Z23 Encounter for immunization: Secondary | ICD-10-CM

## 2020-09-24 MED ORDER — PFIZER-BIONT COVID-19 VAC-TRIS 30 MCG/0.3ML IM SUSP
INTRAMUSCULAR | 0 refills | Status: AC
  Filled 2020-09-24: qty 0.3, 1d supply, fill #0

## 2020-09-24 NOTE — Progress Notes (Signed)
   Covid-19 Vaccination Clinic  Name:  David Mcintyre    MRN: [id number] DOB: 02/08/1951  09/24/2020  [name] was observed post Covid-19 immunization for 15 minutes without incident. He was provided with Vaccine Information Sheet and instruction to access the V-Safe system.   [name] was instructed to call 911 with any severe reactions post vaccine: Marland Kitchen Difficulty breathing  . Swelling of face and throat  . A fast heartbeat  . A bad rash all over body  . Dizziness and weakness   Immunizations Administered    Name Date Dose VIS Date Route   PFIZER Comrnaty(Gray TOP) Covid-19 Vaccine 09/24/2020 10:19 AM 0.3 mL 04/12/2020 Intramuscular   Manufacturer: [employer], [employer]   Lot: [id number]   NDC: [phone]

## 2021-06-25 DIAGNOSIS — E785 Hyperlipidemia, unspecified: Secondary | ICD-10-CM | POA: Diagnosis not present

## 2021-06-25 DIAGNOSIS — Z125 Encounter for screening for malignant neoplasm of prostate: Secondary | ICD-10-CM | POA: Diagnosis not present

## 2021-06-25 DIAGNOSIS — I10 Essential (primary) hypertension: Secondary | ICD-10-CM | POA: Diagnosis not present

## 2021-11-07 DIAGNOSIS — Z125 Encounter for screening for malignant neoplasm of prostate: Secondary | ICD-10-CM | POA: Diagnosis not present

## 2021-11-07 DIAGNOSIS — Z0000 Encounter for general adult medical examination without abnormal findings: Secondary | ICD-10-CM | POA: Diagnosis not present

## 2021-11-07 DIAGNOSIS — E785 Hyperlipidemia, unspecified: Secondary | ICD-10-CM | POA: Diagnosis not present

## 2021-11-07 DIAGNOSIS — I10 Essential (primary) hypertension: Secondary | ICD-10-CM | POA: Diagnosis not present

## 2021-11-13 DIAGNOSIS — Z1389 Encounter for screening for other disorder: Secondary | ICD-10-CM | POA: Diagnosis not present

## 2021-11-13 DIAGNOSIS — Z8546 Personal history of malignant neoplasm of prostate: Secondary | ICD-10-CM | POA: Diagnosis not present

## 2021-11-13 DIAGNOSIS — I10 Essential (primary) hypertension: Secondary | ICD-10-CM | POA: Diagnosis not present

## 2021-11-13 DIAGNOSIS — Z1331 Encounter for screening for depression: Secondary | ICD-10-CM | POA: Diagnosis not present

## 2021-11-13 DIAGNOSIS — Z0000 Encounter for general adult medical examination without abnormal findings: Secondary | ICD-10-CM | POA: Diagnosis not present

## 2022-02-21 ENCOUNTER — Other Ambulatory Visit (HOSPITAL_BASED_OUTPATIENT_CLINIC_OR_DEPARTMENT_OTHER): Payer: Self-pay

## 2022-02-21 MED ORDER — COMIRNATY 30 MCG/0.3ML IM SUSY
PREFILLED_SYRINGE | INTRAMUSCULAR | 0 refills | Status: AC
  Filled 2022-02-21: qty 0.3, 1d supply, fill #0

## 2022-04-23 DIAGNOSIS — H9313 Tinnitus, bilateral: Secondary | ICD-10-CM | POA: Diagnosis not present

## 2022-04-23 DIAGNOSIS — H6592 Unspecified nonsuppurative otitis media, left ear: Secondary | ICD-10-CM | POA: Diagnosis not present

## 2022-06-05 DIAGNOSIS — H93A3 Pulsatile tinnitus, bilateral: Secondary | ICD-10-CM | POA: Diagnosis not present

## 2022-06-05 DIAGNOSIS — H903 Sensorineural hearing loss, bilateral: Secondary | ICD-10-CM | POA: Diagnosis not present

## 2022-06-25 DIAGNOSIS — H93A2 Pulsatile tinnitus, left ear: Secondary | ICD-10-CM | POA: Diagnosis not present

## 2022-06-25 DIAGNOSIS — J3489 Other specified disorders of nose and nasal sinuses: Secondary | ICD-10-CM | POA: Diagnosis not present

## 2022-11-26 DIAGNOSIS — E7849 Other hyperlipidemia: Secondary | ICD-10-CM | POA: Diagnosis not present

## 2022-11-26 DIAGNOSIS — Z8546 Personal history of malignant neoplasm of prostate: Secondary | ICD-10-CM | POA: Diagnosis not present

## 2022-11-26 DIAGNOSIS — E785 Hyperlipidemia, unspecified: Secondary | ICD-10-CM | POA: Diagnosis not present

## 2022-11-26 DIAGNOSIS — I10 Essential (primary) hypertension: Secondary | ICD-10-CM | POA: Diagnosis not present

## 2022-11-26 DIAGNOSIS — Z0000 Encounter for general adult medical examination without abnormal findings: Secondary | ICD-10-CM | POA: Diagnosis not present

## 2022-12-03 DIAGNOSIS — I129 Hypertensive chronic kidney disease with stage 1 through stage 4 chronic kidney disease, or unspecified chronic kidney disease: Secondary | ICD-10-CM | POA: Diagnosis not present

## 2022-12-03 DIAGNOSIS — Z1339 Encounter for screening examination for other mental health and behavioral disorders: Secondary | ICD-10-CM | POA: Diagnosis not present

## 2022-12-03 DIAGNOSIS — Z23 Encounter for immunization: Secondary | ICD-10-CM | POA: Diagnosis not present

## 2022-12-03 DIAGNOSIS — R82998 Other abnormal findings in urine: Secondary | ICD-10-CM | POA: Diagnosis not present

## 2022-12-03 DIAGNOSIS — E785 Hyperlipidemia, unspecified: Secondary | ICD-10-CM | POA: Diagnosis not present

## 2022-12-03 DIAGNOSIS — N1831 Chronic kidney disease, stage 3a: Secondary | ICD-10-CM | POA: Diagnosis not present

## 2022-12-03 DIAGNOSIS — Z1331 Encounter for screening for depression: Secondary | ICD-10-CM | POA: Diagnosis not present

## 2022-12-03 DIAGNOSIS — Z8546 Personal history of malignant neoplasm of prostate: Secondary | ICD-10-CM | POA: Diagnosis not present

## 2022-12-03 DIAGNOSIS — Z0000 Encounter for general adult medical examination without abnormal findings: Secondary | ICD-10-CM | POA: Diagnosis not present

## 2022-12-03 DIAGNOSIS — I10 Essential (primary) hypertension: Secondary | ICD-10-CM | POA: Diagnosis not present

## 2023-09-24 DIAGNOSIS — H25813 Combined forms of age-related cataract, bilateral: Secondary | ICD-10-CM | POA: Diagnosis not present

## 2023-09-24 DIAGNOSIS — H33332 Multiple defects of retina without detachment, left eye: Secondary | ICD-10-CM | POA: Diagnosis not present

## 2023-09-24 DIAGNOSIS — H43813 Vitreous degeneration, bilateral: Secondary | ICD-10-CM | POA: Diagnosis not present

## 2023-09-24 DIAGNOSIS — H35411 Lattice degeneration of retina, right eye: Secondary | ICD-10-CM | POA: Diagnosis not present

## 2023-11-05 DIAGNOSIS — H25813 Combined forms of age-related cataract, bilateral: Secondary | ICD-10-CM | POA: Diagnosis not present

## 2023-11-05 DIAGNOSIS — H31092 Other chorioretinal scars, left eye: Secondary | ICD-10-CM | POA: Diagnosis not present

## 2023-11-05 DIAGNOSIS — H43813 Vitreous degeneration, bilateral: Secondary | ICD-10-CM | POA: Diagnosis not present

## 2023-11-05 DIAGNOSIS — H33321 Round hole, right eye: Secondary | ICD-10-CM | POA: Diagnosis not present

## 2023-11-05 DIAGNOSIS — H33331 Multiple defects of retina without detachment, right eye: Secondary | ICD-10-CM | POA: Diagnosis not present

## 2023-11-30 DIAGNOSIS — H33331 Multiple defects of retina without detachment, right eye: Secondary | ICD-10-CM | POA: Diagnosis not present

## 2023-11-30 DIAGNOSIS — H35411 Lattice degeneration of retina, right eye: Secondary | ICD-10-CM | POA: Diagnosis not present

## 2023-11-30 DIAGNOSIS — H33321 Round hole, right eye: Secondary | ICD-10-CM | POA: Diagnosis not present

## 2023-12-02 DIAGNOSIS — Z125 Encounter for screening for malignant neoplasm of prostate: Secondary | ICD-10-CM | POA: Diagnosis not present

## 2023-12-02 DIAGNOSIS — E785 Hyperlipidemia, unspecified: Secondary | ICD-10-CM | POA: Diagnosis not present

## 2023-12-02 DIAGNOSIS — I129 Hypertensive chronic kidney disease with stage 1 through stage 4 chronic kidney disease, or unspecified chronic kidney disease: Secondary | ICD-10-CM | POA: Diagnosis not present

## 2023-12-02 DIAGNOSIS — N1831 Chronic kidney disease, stage 3a: Secondary | ICD-10-CM | POA: Diagnosis not present

## 2023-12-09 DIAGNOSIS — R82998 Other abnormal findings in urine: Secondary | ICD-10-CM | POA: Diagnosis not present

## 2023-12-09 DIAGNOSIS — E785 Hyperlipidemia, unspecified: Secondary | ICD-10-CM | POA: Diagnosis not present

## 2023-12-09 DIAGNOSIS — Z1331 Encounter for screening for depression: Secondary | ICD-10-CM | POA: Diagnosis not present

## 2023-12-09 DIAGNOSIS — I10 Essential (primary) hypertension: Secondary | ICD-10-CM | POA: Diagnosis not present

## 2023-12-09 DIAGNOSIS — Z1339 Encounter for screening examination for other mental health and behavioral disorders: Secondary | ICD-10-CM | POA: Diagnosis not present

## 2023-12-09 DIAGNOSIS — Z8546 Personal history of malignant neoplasm of prostate: Secondary | ICD-10-CM | POA: Diagnosis not present

## 2023-12-09 DIAGNOSIS — I129 Hypertensive chronic kidney disease with stage 1 through stage 4 chronic kidney disease, or unspecified chronic kidney disease: Secondary | ICD-10-CM | POA: Diagnosis not present

## 2023-12-09 DIAGNOSIS — N1831 Chronic kidney disease, stage 3a: Secondary | ICD-10-CM | POA: Diagnosis not present

## 2023-12-09 DIAGNOSIS — Z0000 Encounter for general adult medical examination without abnormal findings: Secondary | ICD-10-CM | POA: Diagnosis not present

## 2024-02-04 DIAGNOSIS — H43393 Other vitreous opacities, bilateral: Secondary | ICD-10-CM | POA: Diagnosis not present

## 2024-02-04 DIAGNOSIS — H25813 Combined forms of age-related cataract, bilateral: Secondary | ICD-10-CM | POA: Diagnosis not present

## 2024-02-04 DIAGNOSIS — H43813 Vitreous degeneration, bilateral: Secondary | ICD-10-CM | POA: Diagnosis not present

## 2024-02-04 DIAGNOSIS — H31093 Other chorioretinal scars, bilateral: Secondary | ICD-10-CM | POA: Diagnosis not present
